# Patient Record
Sex: Female | Born: 1945 | Race: White | Hispanic: No | Marital: Single | Smoking: Never smoker
Health system: Southern US, Community
[De-identification: ages and names within clinical notes are randomized; demographics above are authoritative.]

## PROBLEM LIST (undated history)

## (undated) DIAGNOSIS — G4700 Insomnia, unspecified: Secondary | ICD-10-CM

## (undated) DIAGNOSIS — Z79890 Hormone replacement therapy (postmenopausal): Secondary | ICD-10-CM

## (undated) DIAGNOSIS — F329 Major depressive disorder, single episode, unspecified: Secondary | ICD-10-CM

## (undated) DIAGNOSIS — F32A Depression, unspecified: Secondary | ICD-10-CM

## (undated) DIAGNOSIS — M1990 Unspecified osteoarthritis, unspecified site: Secondary | ICD-10-CM

## (undated) DIAGNOSIS — F419 Anxiety disorder, unspecified: Secondary | ICD-10-CM

## (undated) HISTORY — DX: Insomnia, unspecified: G47.00

## (undated) HISTORY — DX: Hormone replacement therapy: Z79.890

## (undated) HISTORY — PX: BACK SURGERY: SHX140

---

## 2000-08-17 ENCOUNTER — Encounter: Payer: Self-pay | Admitting: *Deleted

## 2000-08-17 ENCOUNTER — Ambulatory Visit (HOSPITAL_COMMUNITY): Admission: RE | Admit: 2000-08-17 | Discharge: 2000-08-17 | Payer: Self-pay | Admitting: *Deleted

## 2000-08-20 ENCOUNTER — Ambulatory Visit (HOSPITAL_COMMUNITY): Admission: RE | Admit: 2000-08-20 | Discharge: 2000-08-20 | Payer: Self-pay | Admitting: *Deleted

## 2000-08-20 ENCOUNTER — Encounter: Payer: Self-pay | Admitting: *Deleted

## 2003-11-20 ENCOUNTER — Encounter: Admission: RE | Admit: 2003-11-20 | Discharge: 2003-11-20 | Payer: Self-pay | Admitting: Occupational Medicine

## 2011-08-13 ENCOUNTER — Emergency Department (HOSPITAL_COMMUNITY)
Admission: EM | Admit: 2011-08-13 | Discharge: 2011-08-14 | Disposition: A | Discharge status: Home / Self Care | Payer: BC Managed Care – PPO | Attending: Emergency Medicine | Admitting: Emergency Medicine

## 2011-08-13 ENCOUNTER — Emergency Department (HOSPITAL_COMMUNITY): Payer: BC Managed Care – PPO

## 2011-08-13 ENCOUNTER — Encounter (HOSPITAL_COMMUNITY): Payer: Self-pay | Admitting: Emergency Medicine

## 2011-08-13 DIAGNOSIS — IMO0002 Reserved for concepts with insufficient information to code with codable children: Secondary | ICD-10-CM | POA: Insufficient documentation

## 2011-08-13 DIAGNOSIS — M25539 Pain in unspecified wrist: Secondary | ICD-10-CM | POA: Insufficient documentation

## 2011-08-13 DIAGNOSIS — R51 Headache: Secondary | ICD-10-CM | POA: Insufficient documentation

## 2011-08-13 DIAGNOSIS — T148XXA Other injury of unspecified body region, initial encounter: Secondary | ICD-10-CM

## 2011-08-13 DIAGNOSIS — M79609 Pain in unspecified limb: Secondary | ICD-10-CM | POA: Insufficient documentation

## 2011-08-13 DIAGNOSIS — M25529 Pain in unspecified elbow: Secondary | ICD-10-CM | POA: Insufficient documentation

## 2011-08-13 HISTORY — DX: Major depressive disorder, single episode, unspecified: F32.9

## 2011-08-13 HISTORY — DX: Depression, unspecified: F32.A

## 2011-08-13 MED ORDER — OXYCODONE-ACETAMINOPHEN 5-325 MG PO TABS: 2.0000 | ORAL_TABLET | Freq: Four times a day (QID) | ORAL | Status: AC | PRN

## 2011-08-13 MED ORDER — MORPHINE SULFATE 4 MG/ML IJ SOLN
4.0000 mg | Freq: Once | INTRAMUSCULAR | Status: AC
  Administered 2011-08-13: 4 mg via INTRAMUSCULAR
  Filled 2011-08-13: qty 1

## 2011-08-13 MED ORDER — BACITRACIN ZINC 500 UNIT/GM EX OINT: TOPICAL_OINTMENT | Freq: Two times a day (BID) | CUTANEOUS | Status: AC

## 2011-08-13 MED ORDER — HYDROMORPHONE HCL PF 1 MG/ML IJ SOLN
1.0000 mg | Freq: Once | INTRAMUSCULAR | Status: AC
  Administered 2011-08-14: 1 mg via INTRAMUSCULAR
  Filled 2011-08-13: qty 1
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
## 2011-08-13 NOTE — ED Notes (Signed)
Family and pt requested to stay on wheelchair and not stretcher

## 2011-08-13 NOTE — Discharge Instructions (Signed)
Contusion A contusion is a deep bruise. Contusions are the result of an injury that caused bleeding under the skin. The contusion may turn blue, purple, or yellow. Minor injuries will give you a painless contusion, but more severe contusions may stay painful and swollen for a few weeks.  CAUSES  A contusion is usually caused by a blow, trauma, or direct force to an area of the body. SYMPTOMS   Swelling and redness of the injured area.   Bruising of the injured area.   Tenderness and soreness of the injured area.   Pain.  DIAGNOSIS  The diagnosis can be made by taking a history and physical exam. An X-ray, CT scan, or MRI may be needed to determine if there were any associated injuries, such as fractures. TREATMENT  Specific treatment will depend on what area of the body was injured. In general, the best treatment for a contusion is resting, icing, elevating, and applying cold compresses to the injured area. Over-the-counter medicines may also be recommended for pain control. Ask your caregiver what the best treatment is for your contusion. HOME CARE INSTRUCTIONS   Put ice on the injured area.   Put ice in a plastic bag.   Place a towel between your skin and the bag.   Leave the ice on for 15 to 20 minutes, 3 to 4 times a day.   Only take over-the-counter or prescription medicines for pain, discomfort, or fever as directed by your caregiver. Your caregiver may recommend avoiding anti-inflammatory medicines (aspirin, ibuprofen, and naproxen) for 48 hours because these medicines may increase bruising.   Rest the injured area.   If possible, elevate the injured area to reduce swelling.  SEEK IMMEDIATE MEDICAL CARE IF:   You have increased bruising or swelling.   You have pain that is getting worse.   Your swelling or pain is not relieved with medicines.  MAKE SURE YOU:   Understand these instructions.   Will watch your condition.   Will get help right away if you are not  doing well or get worse.  Document Released: 11/23/2004 Document Revised: 02/02/2011 Document Reviewed: 12/19/2010 [facility] Patient Information 2012 [place], [place].   Abrasions Abrasions are skin scrapes. Their treatment depends on how large and deep the abrasion is. Abrasions do not extend through all layers of the skin. A cut or lesion through all skin layers is called a laceration. HOME CARE INSTRUCTIONS   If you were given a dressing, change it at least once a day or as instructed by your caregiver. If the bandage sticks, soak it off with a solution of water or hydrogen peroxide.   Twice a day, wash the area with soap and water to remove all the cream/ointment. You may do this in a sink, under a tub faucet, or in a shower. Rinse off the soap and pat dry with a clean towel. Look for signs of infection (see below).   Reapply cream/ointment according to your caregiver's instruction. This will help prevent infection and keep the bandage from sticking. Telfa or gauze over the wound and under the dressing or wrap will also help keep the bandage from sticking.   If the bandage becomes wet, dirty, or develops a foul smell, change it as soon as possible.   Only take over-the-counter or prescription medicines for pain, discomfort, or fever as directed by your caregiver.  SEEK IMMEDIATE MEDICAL CARE IF:   Increasing pain in the wound.   Signs of infection develop: redness, swelling, surrounding  area is tender to touch, or pus coming from the wound.   You have a fever.   Any foul smell coming from the wound or dressing.  Most skin wounds heal within ten days. Facial wounds heal faster. However, an infection may occur despite proper treatment. You should have the wound checked for signs of infection within 24 to 48 hours or sooner if problems arise. If you were not given a wound-check appointment, look closely at the wound yourself on the second day for early signs of infection listed above. MAKE  SURE YOU:   Understand these instructions.   Will watch your condition.   Will get help right away if you are not doing well or get worse.  Document Released: 11/23/2004 Document Revised: 02/02/2011 Document Reviewed: 01/17/2011 [facility] Patient Information 2012 [place], [place].  RESOURCE GUIDE  Dental Problems  Patients with Medicaid: [facility] [phone] [address]                                           [phone] [address] Phone:  [phone]                                                   Phone:  [phone]  If unable to pay or uninsured, contact:  [facility] or [facility]. to become qualified for the adult dental clinic.  Chronic Pain Problems Contact [facility]  [phone] Patients need to be referred by their primary care doctor.  Insufficient Money for Medicine Contact [employer] Way:  call "211" or [facility] 715-334-9883.  No Primary Care Doctor Call [facility]  [phone] Other agencies that provide inexpensive medical care    [facility]  [phone]    [facility]  [phone]    [facility]  [phone]    [facility]  [phone]    [facility]  [phone]    [facility]  [phone]  Psychological Services [facility] Behavioral Health  [phone] [facility]  [phone] [facility] Mental Health   [phone] (emergency services [phone])  Abuse/Neglect [facility] Child Abuse Hotline 740-425-0583 [facility] Child Abuse Hotline [phone] (After Hours)  Emergency Shelter [facility] [phone]  [facility] [phone] [facility] [phone]  MRSA Hotline #:   (813)803-0741    [facility] Resources  [facility]  United [employer]                           [facility] Dept. [address]                      [address]         [address]  [address]  Cristobal Goldmann Phone:  [phone]                                  Phone:  [phone]                   Phone:  [phone]  [facility] Mental Health Phone:  [phone]  [facility] Child Abuse Hotline [phone] [phone] (After Hours)

## 2011-08-13 NOTE — ED Notes (Signed)
Requested Tobi Bastos, [place] to get pt's medical records from [facility].
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
## 2011-08-14 NOTE — ED Notes (Signed)
Prescription for bacitracin ung 102 g no refill apply topically 2 times daily per discharge instructions per dr Soledad Gerlach webb called in to [facility] at [phone].

## 2011-08-14 NOTE — ED Notes (Signed)
Pt states understanding of discharge instructions 

## 2012-06-05 ENCOUNTER — Other Ambulatory Visit: Payer: Self-pay | Admitting: Family Medicine

## 2012-06-05 ENCOUNTER — Telehealth: Payer: Self-pay | Admitting: Family Medicine

## 2012-06-05 NOTE — Telephone Encounter (Signed)
Chart shows we have never prescribed this med for patient.  Pt has not been seen here in over one year.   Refill sent to pharmacy denied.  Dr Tanya Nones informed.

## 2012-06-06 MED ORDER — TRAZODONE HCL 100 MG PO TABS: ORAL_TABLET | ORAL | Status: DC

## 2012-06-06 MED ORDER — CLONAZEPAM 1 MG PO TABS: 1.0000 mg | ORAL_TABLET | Freq: Two times a day (BID) | ORAL | Status: DC | PRN

## 2012-06-06 NOTE — Telephone Encounter (Signed)
.  [facility]

## 2012-06-06 NOTE — Telephone Encounter (Signed)
Ok to refill both?? 

## 2012-07-05 ENCOUNTER — Telehealth: Payer: Self-pay | Admitting: Family Medicine

## 2012-07-05 MED ORDER — TRAZODONE HCL 100 MG PO TABS: ORAL_TABLET | ORAL | Status: DC

## 2012-07-05 MED ORDER — CLONAZEPAM 1 MG PO TABS: 1.0000 mg | ORAL_TABLET | Freq: Two times a day (BID) | ORAL | Status: DC | PRN

## 2012-07-05 NOTE — Telephone Encounter (Signed)
Rx Refilled  

## 2012-07-05 NOTE — Telephone Encounter (Signed)
Ok to refill 

## 2012-07-05 NOTE — Telephone Encounter (Signed)
?   OK to Refill  

## 2012-07-10 ENCOUNTER — Encounter: Payer: Self-pay | Admitting: Physician Assistant

## 2012-07-10 ENCOUNTER — Ambulatory Visit (INDEPENDENT_AMBULATORY_CARE_PROVIDER_SITE_OTHER): Payer: Medicare Other | Admitting: Physician Assistant

## 2012-07-10 VITALS — BP 140/86 | HR 60 | Temp 98.3°F | Resp 20

## 2012-07-10 DIAGNOSIS — M542 Cervicalgia: Secondary | ICD-10-CM

## 2012-07-10 MED ORDER — MELOXICAM 7.5 MG PO TABS: 7.5000 mg | ORAL_TABLET | Freq: Every day | ORAL | Status: DC

## 2012-07-10 MED ORDER — CYCLOBENZAPRINE HCL 10 MG PO TABS: 10.0000 mg | ORAL_TABLET | Freq: Three times a day (TID) | ORAL | Status: DC | PRN
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
## 2012-08-05 ENCOUNTER — Other Ambulatory Visit: Payer: Self-pay | Admitting: Family Medicine

## 2012-08-06 ENCOUNTER — Telehealth: Payer: Self-pay | Admitting: Family Medicine

## 2012-08-06 NOTE — Telephone Encounter (Signed)
Ok to refill with 5 refill 

## 2012-08-06 NOTE — Telephone Encounter (Signed)
Klonopin can be refilled x 2.

## 2012-08-06 NOTE — Telephone Encounter (Signed)
?  ok to refill °

## 2012-08-07 NOTE — Telephone Encounter (Signed)
?   OK to Refill  

## 2012-08-07 NOTE — Telephone Encounter (Signed)
Ok to refill 

## 2012-08-09 ENCOUNTER — Other Ambulatory Visit: Payer: Self-pay | Admitting: Family Medicine

## 2012-08-09 DIAGNOSIS — M542 Cervicalgia: Secondary | ICD-10-CM

## 2012-08-09 MED ORDER — CYCLOBENZAPRINE HCL 10 MG PO TABS: 10.0000 mg | ORAL_TABLET | Freq: Three times a day (TID) | ORAL | Status: DC | PRN

## 2012-08-09 NOTE — Telephone Encounter (Signed)
done

## 2012-08-09 NOTE — Telephone Encounter (Signed)
Rx Refilled  

## 2012-08-27 ENCOUNTER — Other Ambulatory Visit: Payer: Self-pay | Admitting: Family Medicine

## 2012-08-27 NOTE — Telephone Encounter (Signed)
Med refilled.

## 2012-08-27 NOTE — Telephone Encounter (Signed)
?  ok to refill °

## 2012-08-27 NOTE — Telephone Encounter (Signed)
OKAY TO REFILL?

## 2012-09-04 ENCOUNTER — Ambulatory Visit (INDEPENDENT_AMBULATORY_CARE_PROVIDER_SITE_OTHER): Payer: Medicare Other | Admitting: Family Medicine

## 2012-09-04 ENCOUNTER — Encounter: Payer: Self-pay | Admitting: Family Medicine

## 2012-09-04 VITALS — BP 156/90 | HR 63 | Temp 98.2°F | Resp 16

## 2012-09-04 DIAGNOSIS — Z79890 Hormone replacement therapy (postmenopausal): Secondary | ICD-10-CM | POA: Insufficient documentation

## 2012-09-04 DIAGNOSIS — M6530 Trigger finger, unspecified finger: Secondary | ICD-10-CM

## 2012-09-04 DIAGNOSIS — M75100 Unspecified rotator cuff tear or rupture of unspecified shoulder, not specified as traumatic: Secondary | ICD-10-CM

## 2012-09-04 DIAGNOSIS — M7551 Bursitis of right shoulder: Secondary | ICD-10-CM

## 2012-09-04 DIAGNOSIS — G4700 Insomnia, unspecified: Secondary | ICD-10-CM | POA: Insufficient documentation

## 2012-09-04 MED ORDER — TRAZODONE HCL 100 MG PO TABS: ORAL_TABLET | ORAL | Status: DC

## 2012-09-04 MED ORDER — CONJ ESTROG-MEDROXYPROGEST ACE 0.45-1.5 MG PO TABS: 1.0000 | ORAL_TABLET | Freq: Every day | ORAL | Status: DC

## 2012-09-04 MED ORDER — CLONAZEPAM 1 MG PO TABS: 1.0000 mg | ORAL_TABLET | Freq: Two times a day (BID) | ORAL | Status: DC | PRN

## 2012-09-04 MED ORDER — CYCLOBENZAPRINE HCL 10 MG PO TABS: ORAL_TABLET | ORAL | Status: DC

## 2012-09-04 NOTE — Progress Notes (Signed)
Subjective:    Patient ID: Rachel Kaufman, female    DOB: [birth year], 67 y.o.   MRN: [id number]  HPI  Patient presents with 2 concerns. #1 she complains of pain in her right shoulder. It throbs and aches at night. The pain is worse with abduction and range of motion in the shoulder. It did improve with Flexeril and an anti-inflammatory. The pain has been there now for several months.  She also is complaining of pain in her left thumb. The thumb locks at the IP joint and MCP joint with flexion. This has been occurring for several weeks. It is now tender around the MCP and IP joint. Past Medical History  Diagnosis Date  . Depression   . Insomnia   . Postmenopausal HRT (hormone replacement therapy)    Current Outpatient Prescriptions on File Prior to Visit  Medication Sig Dispense Refill  . buPROPion (WELLBUTRIN XL) 300 MG 24 hr tablet TAKE 1 TABLET BY MOUTH EVERY MORNING  30 tablet  5  . escitalopram (LEXAPRO) 20 MG tablet Take 20 mg by mouth daily.      . fish oil-omega-3 fatty acids 1000 MG capsule Take 2 g by mouth daily.      . vitamin B-12 (CYANOCOBALAMIN) 1000 MCG tablet Take 1,000 mcg by mouth daily.      . vitamin C (ASCORBIC ACID) 500 MG tablet Take 500 mg by mouth daily.      . meloxicam (MOBIC) 7.5 MG tablet Take 1 tablet (7.5 mg total) by mouth daily.  30 tablet  0  . oxyCODONE-acetaminophen (PERCOCET) 5-325 MG per tablet Take 1 tablet by mouth every 4 (four) hours as needed.       No current facility-administered medications on file prior to visit.   Allergies  Allergen Reactions  . Codeine Nausea And Vomiting   History   Social History  . Marital Status: Single    Spouse Name: N/A    Number of Children: N/A  . Years of Education: N/A   Occupational History  . Not on file.   Social History Main Topics  . Smoking status: Never Smoker   . Smokeless tobacco: Not on file  . Alcohol Use: Yes  . Drug Use: Yes    Special: Marijuana  . Sexually Active:    Other  Topics Concern  . Not on file   Social History Narrative  . No narrative on file     Review of Systems  All other systems reviewed and are negative.       Objective:   Physical Exam  Vitals reviewed. Cardiovascular: Normal rate, regular rhythm and normal heart sounds.   Pulmonary/Chest: Effort normal and breath sounds normal.   patient has a positive empty can sign in the right shoulder, she has a positive Hawkins maneuver in the right shoulder. She has a negative Spurling's sign. She has normal reflexes in the right arm as well as normal strength. She has pain with abduction greater than 90. She has a palpable trigger finger on the volar aspect of the left first Mcp joint.  Repeat blood pressure is 130/90        Assessment & Plan:  1. Subacromial bursitis, right Using sterile technique, the right subacromial space was injected with a combination of 2 cc Marcaine, 2 cc of lidocaine, and 2 cc of 40 mg per mL Kenalog. The patient is to follow up in one to 2 weeks if no better.  Warnings were given regarding septic arthritis 2. Trigger  finger, acquired Recommended she see her orthopedist for a trigger finger injection which I do not perform.  She is scheduled to see Dr. Ranell Patrick later this month and will ask him to  perform the injection

## 2012-09-05 ENCOUNTER — Telehealth: Payer: Self-pay | Admitting: Family Medicine

## 2012-09-06 MED ORDER — ZOSTER VACCINE LIVE 19400 UNT/0.65ML ~~LOC~~ SOLR: 0.6500 mL | Freq: Once | SUBCUTANEOUS | Status: DC

## 2012-09-06 NOTE — Telephone Encounter (Signed)
Printed and mailed

## 2012-09-29 ENCOUNTER — Other Ambulatory Visit: Payer: Self-pay | Admitting: Family Medicine

## 2012-09-30 NOTE — Telephone Encounter (Signed)
Medication refilled per protocol. 

## 2012-10-09 ENCOUNTER — Other Ambulatory Visit: Payer: Self-pay | Admitting: Family Medicine

## 2012-10-09 NOTE — Telephone Encounter (Signed)
?   OK to Refill  

## 2012-10-10 NOTE — Telephone Encounter (Signed)
ok 

## 2012-10-10 NOTE — Telephone Encounter (Signed)
Med phoned in °

## 2012-10-14 ENCOUNTER — Emergency Department (HOSPITAL_BASED_OUTPATIENT_CLINIC_OR_DEPARTMENT_OTHER)
Admission: EM | Admit: 2012-10-14 | Discharge: 2012-10-14 | Disposition: A | Discharge status: Home / Self Care | Payer: Medicare Other | Attending: Emergency Medicine | Admitting: Emergency Medicine

## 2012-10-14 ENCOUNTER — Emergency Department (HOSPITAL_BASED_OUTPATIENT_CLINIC_OR_DEPARTMENT_OTHER): Payer: Medicare Other

## 2012-10-14 ENCOUNTER — Encounter (HOSPITAL_BASED_OUTPATIENT_CLINIC_OR_DEPARTMENT_OTHER): Payer: Self-pay | Admitting: *Deleted

## 2012-10-14 DIAGNOSIS — S4991XA Unspecified injury of right shoulder and upper arm, initial encounter: Secondary | ICD-10-CM

## 2012-10-14 DIAGNOSIS — F329 Major depressive disorder, single episode, unspecified: Secondary | ICD-10-CM | POA: Insufficient documentation

## 2012-10-14 DIAGNOSIS — Z79899 Other long term (current) drug therapy: Secondary | ICD-10-CM | POA: Insufficient documentation

## 2012-10-14 DIAGNOSIS — G4700 Insomnia, unspecified: Secondary | ICD-10-CM | POA: Insufficient documentation

## 2012-10-14 DIAGNOSIS — Y9389 Activity, other specified: Secondary | ICD-10-CM | POA: Insufficient documentation

## 2012-10-14 DIAGNOSIS — R296 Repeated falls: Secondary | ICD-10-CM | POA: Insufficient documentation

## 2012-10-14 DIAGNOSIS — S4980XA Other specified injuries of shoulder and upper arm, unspecified arm, initial encounter: Secondary | ICD-10-CM | POA: Insufficient documentation

## 2012-10-14 DIAGNOSIS — Z0489 Encounter for examination and observation for other specified reasons: Secondary | ICD-10-CM | POA: Insufficient documentation

## 2012-10-14 DIAGNOSIS — Y929 Unspecified place or not applicable: Secondary | ICD-10-CM | POA: Insufficient documentation

## 2012-10-14 DIAGNOSIS — S62309A Unspecified fracture of unspecified metacarpal bone, initial encounter for closed fracture: Secondary | ICD-10-CM

## 2012-10-14 DIAGNOSIS — S62319A Displaced fracture of base of unspecified metacarpal bone, initial encounter for closed fracture: Secondary | ICD-10-CM | POA: Insufficient documentation

## 2012-10-14 DIAGNOSIS — F3289 Other specified depressive episodes: Secondary | ICD-10-CM | POA: Insufficient documentation

## 2012-10-14 DIAGNOSIS — S46909A Unspecified injury of unspecified muscle, fascia and tendon at shoulder and upper arm level, unspecified arm, initial encounter: Secondary | ICD-10-CM | POA: Insufficient documentation

## 2012-10-14 MED ORDER — TRAMADOL HCL 50 MG PO TABS
50.0000 mg | ORAL_TABLET | Freq: Once | ORAL | Status: AC
  Administered 2012-10-14: 50 mg via ORAL
  Filled 2012-10-14: qty 1

## 2012-10-14 MED ORDER — TRAMADOL HCL 50 MG PO TABS: 50.0000 mg | ORAL_TABLET | Freq: Four times a day (QID) | ORAL | Status: DC | PRN

## 2012-10-14 NOTE — ED Provider Notes (Signed)
CSN: [id number]     Arrival date & time 10/14/12  1341 History     First MD Initiated Contact with Patient 10/14/12 1349     Chief Complaint  Patient presents with  . Fall   (Consider location/radiation/quality/duration/timing/severity/associated sxs/prior Treatment) HPI Comments: Patient is a 67 year old female who presents with 3 day history of right shoulder and right hand pain that started immediately after a mechanical fall. Patient reports bracing her fall with her right hand. The pain is throbbing and severe without radiation. Patient reports associated right hand swelling and bruising. Movement and palpation of each joint makes the pain worse. No alleviating factors. Patient denies any other injury.    Past Medical History  Diagnosis Date  . Depression   . Insomnia   . Postmenopausal HRT (hormone replacement therapy)    Past Surgical History  Procedure Laterality Date  . Back surgery     History reviewed. No pertinent family history. History  Substance Use Topics  . Smoking status: Never Smoker   . Smokeless tobacco: Not on file  . Alcohol Use: Yes   OB History   Grav Para Term Preterm Abortions TAB SAB Ect Mult Living                 Review of Systems  Musculoskeletal: Positive for joint swelling and arthralgias.  All other systems reviewed and are negative.    Allergies  Codeine  Home Medications   Current Outpatient Rx  Name  Route  Sig  Dispense  Refill  . buPROPion (WELLBUTRIN XL) 300 MG 24 hr tablet      TAKE 1 TABLET BY MOUTH EVERY MORNING   30 tablet   5   . clonazePAM (KLONOPIN) 1 MG tablet   Oral   Take 1 tablet (1 mg total) by mouth 2 (two) times daily as needed for anxiety.   60 tablet   2   . cyclobenzaprine (FLEXERIL) 10 MG tablet      TAKE 1 TABLET 3 TIMES A DAY AS NEEDED FOR MUSCLE SPASMS   30 tablet   0   . escitalopram (LEXAPRO) 20 MG tablet   Oral   Take 20 mg by mouth daily.         . fish oil-omega-3 fatty acids  1000 MG capsule   Oral   Take 2 g by mouth daily.         . meloxicam (MOBIC) 7.5 MG tablet   Oral   Take 1 tablet (7.5 mg total) by mouth daily.   30 tablet   0   . PREMPRO 0.45-1.5 MG per tablet      TAKE 1 TABLET DAILY (NEED APPT)   28 tablet   4   . traZODone (DESYREL) 100 MG tablet      TAKE 2 TABLETS BY MOUTH AT BEDTIME   60 tablet   5   . vitamin B-12 (CYANOCOBALAMIN) 1000 MCG tablet   Oral   Take 1,000 mcg by mouth daily.         . vitamin C (ASCORBIC ACID) 500 MG tablet   Oral   Take 500 mg by mouth daily.         Marland Kitchen zoster vaccine live, PF, (ZOSTAVAX) 82956 UNT/0.65ML injection   Subcutaneous   Inject 19,400 Units into the skin once.   1 each   0    BP 125/70  Pulse 78  Temp(Src) 98.5 F (36.9 C) (Oral)  Resp 18  Ht 5'  3" (1.6 m)  Wt 130 lb (58.968 kg)  BMI 23.03 kg/m2  SpO2 97% Physical Exam  Nursing note and vitals reviewed. Constitutional: She is oriented to person, place, and time. She appears well-developed and well-nourished. No distress.  HENT:  Head: Normocephalic and atraumatic.  Eyes: Conjunctivae and EOM are normal.  Neck: Normal range of motion.  Cardiovascular: Normal rate and regular rhythm.  Exam reveals no gallop and no friction rub.   No murmur heard. Pulmonary/Chest: Effort normal and breath sounds normal. She has no wheezes. She has no rales. She exhibits no tenderness.  Abdominal: Soft. There is no tenderness.  Musculoskeletal:  Right shoulder-ROM limited due to pain, mild anterior tenderness to palpation, no obvious deformity  Right hand-ROM of hand and fingers limited due to pain and swelling, dorsal bruising noted, tenderness to palpation over 5th metacarpal bone, no obvious deformity  Neurological: She is alert and oriented to person, place, and time. Coordination normal.  Upper extremity sensation equal and intact bilaterally. Speech is goal-oriented. Moves limbs without ataxia.   Skin: Skin is warm and dry.   Psychiatric: She has a normal mood and affect. Her behavior is normal.    ED Course   Procedures (including critical care time)  SPLINT APPLICATION Date/Time: 07/05/2012 3:38 PM Authorized by: Emilia Beck Consent: Verbal consent obtained. Risks and benefits: risks, benefits and alternatives were discussed Consent given by: patient Splint applied by: orthopedic technician Location details: right wrist Splint type: ulnar gutter Supplies used: ortho glass Post-procedure: The splinted body part was neurovascularly unchanged following the procedure. Patient tolerance: Patient tolerated the procedure well with no immediate complications.   Labs Reviewed - No data to display Dg Shoulder Right  10/14/2012   *RADIOLOGY REPORT*  Clinical Data: 67 year old female with right shoulder pain following fall  RIGHT SHOULDER - 2+ VIEW  Comparison: None  Findings: There is no evidence of acute fracture, subluxation or dislocation. Degenerative changes at the glenohumeral and AC joints noted. No focal bony lesions are identified. The visualized right bony thorax is unremarkable.  IMPRESSION: No evidence of acute bony abnormality.   Original Report Authenticated By: Harmon Pier, M.D.   Dg Hand Complete Right  10/14/2012   *RADIOLOGY REPORT*  Clinical Data: 67 year old female with right hand injury and pain.  RIGHT HAND - COMPLETE 3+ VIEW  Comparison: None  Findings: A fracture of the proximal fifth metacarpal is identified with 2 mm posterior and medial displacement. This fracture does not appear to extend into the carpometacarpal joint. There is no evidence of subluxation or dislocation. No other focal bony abnormalities are identified.  IMPRESSION: Minimally displaced proximal fifth metacarpal fracture.   Original Report Authenticated By: Harmon Pier, M.D.   1. Metacarpal bone fracture, closed, initial encounter   2. Right shoulder injury, initial encounter     MDM  2:54 PM Shoulder xray  unremarkable. Right hand xray shows proximal 5th metacarpal fracture. Patient will have ulnar gutter splint and follow up with Orthopedics. No neurovascular compromise.   Emilia Beck, PA-C 10/14/12 1523

## 2012-10-14 NOTE — ED Notes (Signed)
Pt c/o fall from standing injury ing right shoulder and left hand x 3 days ago

## 2012-10-14 NOTE — ED Provider Notes (Signed)
Medical screening examination/treatment/procedure(s) were performed by non-physician practitioner and as supervising physician I was immediately available for consultation/collaboration.   Lyanne Co, MD 10/14/12 [phone]

## 2012-10-23 ENCOUNTER — Telehealth: Payer: Self-pay | Admitting: Family Medicine

## 2012-10-23 NOTE — Telephone Encounter (Signed)
?   OK to Refill  

## 2012-10-24 NOTE — Telephone Encounter (Signed)
She can have 1 refill but no further.  I am not sure what doses she is on, can we verify with patient.  She needs to find GYN because I am not comfortable prescribing testosterone to a female.

## 2012-10-25 MED ORDER — UNABLE TO FIND: Status: DC

## 2012-10-25 NOTE — Telephone Encounter (Signed)
.  Rx Refilled and Patient aware

## 2012-10-25 NOTE — Addendum Note (Signed)
Addended by: Legrand Rams B on: 10/25/2012 10:10 AM   Modules accepted: Orders

## 2012-11-05 ENCOUNTER — Other Ambulatory Visit: Payer: Self-pay | Admitting: Family Medicine

## 2012-11-05 NOTE — Telephone Encounter (Signed)
ok 

## 2012-11-05 NOTE — Telephone Encounter (Signed)
Ok to refill 

## 2012-12-03 ENCOUNTER — Other Ambulatory Visit: Payer: Self-pay | Admitting: Family Medicine

## 2012-12-03 NOTE — Telephone Encounter (Signed)
Last OV 7/9  Last RF 9/9/ #30  OK refill?

## 2012-12-03 NOTE — Telephone Encounter (Signed)
ok 

## 2012-12-03 NOTE — Telephone Encounter (Signed)
RX called in .

## 2012-12-09 ENCOUNTER — Other Ambulatory Visit: Payer: Self-pay | Admitting: Family Medicine

## 2012-12-09 MED ORDER — ESCITALOPRAM OXALATE 20 MG PO TABS: 20.0000 mg | ORAL_TABLET | Freq: Every day | ORAL | Status: DC

## 2012-12-09 NOTE — Telephone Encounter (Signed)
Rx Refilled  

## 2012-12-13 ENCOUNTER — Ambulatory Visit: Payer: Medicare Other | Admitting: Family Medicine

## 2012-12-30 ENCOUNTER — Other Ambulatory Visit: Payer: Self-pay | Admitting: Family Medicine

## 2012-12-30 NOTE — Telephone Encounter (Signed)
rx called in

## 2012-12-30 NOTE — Telephone Encounter (Signed)
ok 

## 2012-12-30 NOTE — Telephone Encounter (Signed)
?   OK to Refill  

## 2013-01-27 ENCOUNTER — Other Ambulatory Visit: Payer: Self-pay | Admitting: Family Medicine

## 2013-01-27 NOTE — Telephone Encounter (Signed)
Last OV 09/04/12.  Last RF clonazepam 7/9 #60 + 2   Last RF cyclobenzaprine 11/3  #30.  OK refills?

## 2013-01-28 NOTE — Telephone Encounter (Signed)
ok 

## 2013-02-28 ENCOUNTER — Other Ambulatory Visit: Payer: Self-pay | Admitting: Family Medicine

## 2013-02-28 NOTE — Telephone Encounter (Signed)
ok °

## 2013-02-28 NOTE — Telephone Encounter (Signed)
.?   OK to Refill

## 2013-03-25 ENCOUNTER — Other Ambulatory Visit: Payer: Self-pay | Admitting: Family Medicine

## 2013-03-25 ENCOUNTER — Encounter: Payer: Self-pay | Admitting: Family Medicine

## 2013-03-25 NOTE — Telephone Encounter (Signed)
Medication refill for one time only.  Patient needs to be seen.  Letter sent for patient to call and schedule

## 2013-04-11 ENCOUNTER — Ambulatory Visit (INDEPENDENT_AMBULATORY_CARE_PROVIDER_SITE_OTHER): Payer: Medicare Other | Admitting: Family Medicine

## 2013-04-11 ENCOUNTER — Encounter: Payer: Self-pay | Admitting: Family Medicine

## 2013-04-11 VITALS — BP 126/82 | HR 70 | Temp 97.3°F | Resp 14 | Ht 63.0 in | Wt 125.0 lb

## 2013-04-11 DIAGNOSIS — M5010 Cervical disc disorder with radiculopathy, unspecified cervical region: Secondary | ICD-10-CM

## 2013-04-11 DIAGNOSIS — Z13220 Encounter for screening for lipoid disorders: Secondary | ICD-10-CM

## 2013-04-11 DIAGNOSIS — F329 Major depressive disorder, single episode, unspecified: Secondary | ICD-10-CM

## 2013-04-11 DIAGNOSIS — M5412 Radiculopathy, cervical region: Secondary | ICD-10-CM

## 2013-04-11 DIAGNOSIS — F3289 Other specified depressive episodes: Secondary | ICD-10-CM

## 2013-04-11 DIAGNOSIS — F32A Depression, unspecified: Secondary | ICD-10-CM

## 2013-04-11 MED ORDER — LORAZEPAM 1 MG PO TABS: 1.0000 mg | ORAL_TABLET | Freq: Two times a day (BID) | ORAL | Status: DC | PRN

## 2013-04-11 MED ORDER — PREDNISONE 20 MG PO TABS: ORAL_TABLET | ORAL | Status: DC

## 2013-04-11 NOTE — Progress Notes (Signed)
Subjective:    Patient ID: Rachel Kaufman, female    DOB: 10/25/1945, 68 y.o.   MRN: [id number]  HPI Patient was asked to come in for a medication check. She is currently taking Wellbutrin XL 300 mg by mouth daily as well as Lexapro 20 mg by mouth daily for depression. She is satisfied with the success of his medication. She is however requiring Klonopin 1 mg by mouth twice a day for anxiety. Her husband was recently diagnosed with throat cancer. This is triggered significant anxiety and occasional panic attacks. The patient does not believe the Klonopin and strong enough. She would like to try something with a shorter half-life is alert and stronger to help her deal with anxiety as she has become habituated to the Klonopin. She is also overdue for lab work. She is also overdue for her pneumonia vaccine. She also needs a refill on her prempro which she takes for vasomotor symptoms along with vaginal dryness. She is in bed increase risk of breast cancer, blood clots, and stroke on home replacement therapy. The patient states that she would "take her chances" rather than stop the medication. Unfortunately she also has developed significant pain in her right shoulder. The pain radiates from the right side of her neck into her right shoulder and down her right arm into her right fingers she describes as a dull constant ache. Chest is pain in the right shoulder with abduction greater than 90. Past Medical History  Diagnosis Date   Depression    Insomnia    Postmenopausal HRT (hormone replacement therapy)    Current Outpatient Prescriptions on File Prior to Visit  Medication Sig Dispense Refill   buPROPion (WELLBUTRIN XL) 300 MG 24 hr tablet TAKE 1 TABLET BY MOUTH EVERY MORNING  30 tablet  0   clonazePAM (KLONOPIN) 1 MG tablet TAKE 1 TABLET TWICE DAILY AS NEEDED FOR ANXIETY  60 tablet  2   cyclobenzaprine (FLEXERIL) 10 MG tablet TAKE 1 TABLET BY MOUTH 3 TIMES A DAY AS NEEDED  30 tablet  0    escitalopram (LEXAPRO) 20 MG tablet TAKE 1 TABLET (20 MG TOTAL) BY MOUTH DAILY.  30 tablet  3   fish oil-omega-3 fatty acids 1000 MG capsule Take 2 g by mouth daily.       PREMPRO 0.45-1.5 MG per tablet TAKE 1 TABLET DAILY (NEED APPT)  28 tablet  4   traMADol (ULTRAM) 50 MG tablet Take 1 tablet (50 mg total) by mouth every 6 (six) hours as needed for pain.  15 tablet  0   traZODone (DESYREL) 100 MG tablet TAKE 2 TABLETS BY MOUTH AT BEDTIME  60 tablet  5   UNABLE TO FIND Testosterone 8% mix with cetaphil cream - Apply prn - could not find this medication in our computer - her rx # is [id number] and she can have it filled just once and at last gram as well not sure how much she gets at a time. thanks  30 g  0   vitamin B-12 (CYANOCOBALAMIN) 1000 MCG tablet Take 1,000 mcg by mouth daily.       vitamin C (ASCORBIC ACID) 500 MG tablet Take 500 mg by mouth daily.       No current facility-administered medications on file prior to visit.   Allergies  Allergen Reactions   Codeine Nausea And Vomiting   History   Social History   Marital Status: Single    Spouse Name: N/A    Number of  Children: N/A   Years of Education: N/A   Occupational History   Not on file.   Social History Main Topics   Smoking status: Never Smoker    Smokeless tobacco: Not on file   Alcohol Use: Yes   Drug Use: Yes    Special: Marijuana   Sexual Activity: Yes    Birth Control/ Protection: None   Other Topics Concern   Not on file   Social History Narrative   No narrative on file      Review of Systems  All other systems reviewed and are negative.       Objective:   Physical Exam  Vitals reviewed. Neck: Neck supple. No thyromegaly present.  Cardiovascular: Normal rate, regular rhythm and normal heart sounds.   No murmur heard. Pulmonary/Chest: Effort normal and breath sounds normal. No respiratory distress. She has no wheezes. She has no rales.  Abdominal: Soft. Bowel sounds are  normal. She exhibits no distension. There is no tenderness. There is no rebound and no guarding.  Musculoskeletal:       Right shoulder: She exhibits decreased range of motion and pain. She exhibits no spasm and normal strength.  Lymphadenopathy:    She has no cervical adenopathy.   patient has a mildly positive Hawkins sign. She has a mildly positive empty can sign in the right shoulder. Chest pain with abduction greater than 90. She also has a positive Spurling sign in the neck        Assessment & Plan:  1. Cervical disc disorder with radiculopathy of cervical region The patient may have 2 problems including cervical disc disorder with radiculopathy as well as possible subacromial bursitis. I recommended an x-ray of the neck as well as the shoulder. Currently the patient is too preoccupied in caring for her husband to go for imaging. She would like to try a prednisone taper pack to see if this will alleviate some of the pain in her neck and shoulder. - predniSONE (DELTASONE) 20 MG tablet; 3 tabs poqday 1-2, 2 tabs poqday 3-4, 1 tab poqday 5-6  Dispense: 12 tablet; Refill: 0  2. Screening cholesterol level Obtain baseline screening labs. - COMPLETE METABOLIC PANEL WITH GFR - Lipid panel - CBC with Differential  3. Depression Continue Lexapro and Wellbutrin. The patient can continue to use trazodone to help her sleep at night. Discontinue Klonopin and switch the patient temporarily to Ativan 1 mg by mouth twice a day for anxiety. The patient has become habituated to Klonopin and it is no longer helping with the breakthrough panic attacks. Given the recent stress that she is under with her husband's throat cancer diagnosis I believe she temporarily needs a new medication that can help treat her panic attacks more effectively.

## 2013-04-14 ENCOUNTER — Other Ambulatory Visit: Payer: Self-pay | Admitting: Family Medicine

## 2013-04-21 ENCOUNTER — Other Ambulatory Visit: Payer: Self-pay | Admitting: Family Medicine

## 2013-04-21 NOTE — Telephone Encounter (Signed)
Refill appropriate and filled per protocol. °

## 2013-05-15 ENCOUNTER — Other Ambulatory Visit: Payer: Self-pay | Admitting: Family Medicine

## 2013-05-15 NOTE — Telephone Encounter (Signed)
Prescription sent to pharmacy.

## 2013-05-15 NOTE — Telephone Encounter (Signed)
ok 

## 2013-05-15 NOTE — Telephone Encounter (Signed)
Ok to refill??  Last office visit 04/11/2013.  Last refill 04/22/2013.

## 2013-05-28 ENCOUNTER — Other Ambulatory Visit: Payer: Self-pay | Admitting: Orthopedic Surgery

## 2013-05-28 DIAGNOSIS — S129XXA Fracture of neck, unspecified, initial encounter: Secondary | ICD-10-CM

## 2013-05-30 ENCOUNTER — Ambulatory Visit
Admission: RE | Admit: 2013-05-30 | Discharge: 2013-05-30 | Disposition: A | Discharge status: Home / Self Care | Payer: Medicare Other | Source: Ambulatory Visit | Attending: Orthopedic Surgery | Admitting: Orthopedic Surgery

## 2013-05-30 DIAGNOSIS — S129XXA Fracture of neck, unspecified, initial encounter: Secondary | ICD-10-CM

## 2013-06-09 ENCOUNTER — Other Ambulatory Visit: Payer: Self-pay | Admitting: Family Medicine

## 2013-06-09 NOTE — Telephone Encounter (Signed)
05/15/13 #30 Last RF Flexeril.    04/11/13 #60 + 1 last Rf Ativan  OK refill?

## 2013-06-09 NOTE — Telephone Encounter (Signed)
RX faxed to pharmacy.

## 2013-07-02 ENCOUNTER — Encounter: Payer: Self-pay | Admitting: Family Medicine

## 2013-07-07 ENCOUNTER — Other Ambulatory Visit: Payer: Self-pay | Admitting: Family Medicine

## 2013-07-07 NOTE — Telephone Encounter (Signed)
Medication called to pharmacy. 

## 2013-07-07 NOTE — Telephone Encounter (Signed)
ok 

## 2013-07-07 NOTE — Telephone Encounter (Signed)
Ok to refill??  Last office visit 04/01/2013.  Last refill 06/09/2013.

## 2013-07-08 LAB — LIPID PANEL
Cholesterol: 189 mg/dL (ref 0–200)
HDL: 71 mg/dL (ref >39)
LDL Cholesterol: 101 mg/dL — ABNORMAL HIGH (ref 0–99)
Total CHOL/HDL Ratio: 2.7 Ratio
Triglycerides: 84 mg/dL (ref <150)
VLDL: 17 mg/dL (ref 0–40)

## 2013-07-08 LAB — CBC WITH DIFFERENTIAL/PLATELET
Basophils Absolute: 0 10*3/uL (ref 0.0–0.1)
Basophils Relative: 0 % (ref 0–1)
Eosinophils Absolute: 0.1 10*3/uL (ref 0.0–0.7)
Eosinophils Relative: 2 % (ref 0–5)
HCT: 36.7 % (ref 36.0–46.0)
Hemoglobin: 12.1 g/dL (ref 12.0–15.0)
Lymphocytes Relative: 22 % (ref 12–46)
Lymphs Abs: 0.9 10*3/uL (ref 0.7–4.0)
MCH: 31 pg (ref 26.0–34.0)
MCHC: 33 g/dL (ref 30.0–36.0)
MCV: 94.1 fL (ref 78.0–100.0)
Monocytes Absolute: 0.3 10*3/uL (ref 0.1–1.0)
Monocytes Relative: 7 % (ref 3–12)
Neutro Abs: 2.7 10*3/uL (ref 1.7–7.7)
Neutrophils Relative %: 69 % (ref 43–77)
Platelets: 194 10*3/uL (ref 150–400)
RBC: 3.9 MIL/uL (ref 3.87–5.11)
RDW: 13.8 % (ref 11.5–15.5)
WBC: 3.9 10*3/uL — ABNORMAL LOW (ref 4.0–10.5)

## 2013-07-08 LAB — COMPLETE METABOLIC PANEL WITH GFR
ALT: 11 U/L (ref 0–35)
AST: 16 U/L (ref 0–37)
Albumin: 4.3 g/dL (ref 3.5–5.2)
Alkaline Phosphatase: 39 U/L (ref 39–117)
BUN: 22 mg/dL (ref 6–23)
CO2: 23 mEq/L (ref 19–32)
Calcium: 9.1 mg/dL (ref 8.4–10.5)
Chloride: 106 mEq/L (ref 96–112)
Creat: 0.85 mg/dL (ref 0.50–1.10)
GFR, Est African American: 83 mL/min
GFR, Est Non African American: 72 mL/min
Glucose, Bld: 88 mg/dL (ref 70–99)
Potassium: 4 mEq/L (ref 3.5–5.3)
Sodium: 142 mEq/L (ref 135–145)
Total Bilirubin: 0.5 mg/dL (ref 0.2–1.2)
Total Protein: 6.3 g/dL (ref 6.0–8.3)

## 2013-08-07 ENCOUNTER — Other Ambulatory Visit: Payer: Self-pay | Admitting: Family Medicine

## 2013-08-07 NOTE — Telephone Encounter (Signed)
ok 

## 2013-08-07 NOTE — Telephone Encounter (Signed)
?   OK to Refill  

## 2013-08-08 NOTE — Telephone Encounter (Signed)
rx called in

## 2013-08-15 ENCOUNTER — Other Ambulatory Visit: Payer: Self-pay | Admitting: Family Medicine

## 2013-08-15 NOTE — Telephone Encounter (Signed)
Refill appropriate and filled per protocol. 

## 2013-09-08 ENCOUNTER — Other Ambulatory Visit: Payer: Self-pay | Admitting: Family Medicine

## 2013-09-08 NOTE — Telephone Encounter (Signed)
ok 

## 2013-09-08 NOTE — Telephone Encounter (Signed)
Medication called to pharmacy. 

## 2013-09-08 NOTE — Telephone Encounter (Signed)
?   OK to Refill  

## 2013-09-15 ENCOUNTER — Other Ambulatory Visit: Payer: Self-pay | Admitting: Family Medicine

## 2013-09-15 NOTE — Telephone Encounter (Signed)
Refill appropriate and filled per protocol. 

## 2013-09-30 ENCOUNTER — Other Ambulatory Visit: Payer: Self-pay | Admitting: Family Medicine

## 2013-10-09 ENCOUNTER — Other Ambulatory Visit: Payer: Self-pay | Admitting: Family Medicine

## 2013-10-09 NOTE — Telephone Encounter (Signed)
Ok to refill??  Last office visit 04/11/2013.  Last refill 09/08/2013.

## 2013-10-09 NOTE — Telephone Encounter (Signed)
ok 

## 2013-10-10 NOTE — Telephone Encounter (Signed)
Medication called to pharmacy. 

## 2013-11-10 ENCOUNTER — Other Ambulatory Visit: Payer: Self-pay | Admitting: Family Medicine

## 2013-11-10 NOTE — Telephone Encounter (Signed)
ok 

## 2013-11-10 NOTE — Telephone Encounter (Signed)
Ok to refill??  Last office visit 04/11/2013.  Last refill 10/10/2013.

## 2013-11-10 NOTE — Telephone Encounter (Signed)
Medication called to pharmacy. 

## 2013-12-09 ENCOUNTER — Other Ambulatory Visit: Payer: Self-pay | Admitting: Family Medicine

## 2013-12-09 NOTE — Telephone Encounter (Signed)
?   OK to Refill  

## 2013-12-09 NOTE — Telephone Encounter (Signed)
ok 

## 2014-01-07 ENCOUNTER — Other Ambulatory Visit: Payer: Self-pay | Admitting: Family Medicine

## 2014-01-07 NOTE — Telephone Encounter (Signed)
?   OK to Refill  

## 2014-01-08 NOTE — Telephone Encounter (Signed)
ok 

## 2014-01-08 NOTE — Telephone Encounter (Signed)
Prescription sent to pharmacy.

## 2014-01-09 ENCOUNTER — Ambulatory Visit (INDEPENDENT_AMBULATORY_CARE_PROVIDER_SITE_OTHER): Payer: Medicare Other | Admitting: Family Medicine

## 2014-01-09 ENCOUNTER — Encounter: Payer: Self-pay | Admitting: Family Medicine

## 2014-01-09 VITALS — BP 140/80 | HR 76 | Temp 98.6°F | Resp 18 | Ht 63.0 in

## 2014-01-09 DIAGNOSIS — R5383 Other fatigue: Secondary | ICD-10-CM

## 2014-01-09 DIAGNOSIS — Z23 Encounter for immunization: Secondary | ICD-10-CM

## 2014-01-09 DIAGNOSIS — G4700 Insomnia, unspecified: Secondary | ICD-10-CM

## 2014-01-09 DIAGNOSIS — M25511 Pain in right shoulder: Secondary | ICD-10-CM

## 2014-01-09 LAB — TSH: TSH: 2.127 u[IU]/mL (ref 0.350–4.500)

## 2014-01-09 MED ORDER — TRAZODONE HCL 100 MG PO TABS: ORAL_TABLET | ORAL | Status: DC

## 2014-01-09 MED ORDER — HYDROCODONE-ACETAMINOPHEN 5-325 MG PO TABS: 1.0000 | ORAL_TABLET | Freq: Four times a day (QID) | ORAL | Status: DC | PRN

## 2014-01-09 MED ORDER — CYCLOBENZAPRINE HCL 10 MG PO TABS: ORAL_TABLET | ORAL | Status: DC

## 2014-01-09 MED ORDER — LORAZEPAM 1 MG PO TABS: ORAL_TABLET | ORAL | Status: DC

## 2014-01-09 NOTE — Addendum Note (Signed)
Addended by: Legrand RamsWILLIS, SANDY B on: 01/09/2014 11:59 AM   Modules accepted: Orders

## 2014-01-09 NOTE — Progress Notes (Signed)
Subjective:    Patient ID: Rachel ShipBrenda Steele, female    DOB: 09/10/1945, 68 y.o.   MRN: [id number]  HPI Patient is a pleasant 68 year old white female who is requesting today a refill on her trazodone. She takes 200 mg at night to help her sleep. She also takes Ativan 1 mg twice a day as needed for anxiety. These medications are working well for her. Unfortunately she continues to have pain in her neck and in her right shoulder. She is seeing orthopedics was discovered that she has a chronically torn right rotator cuff. Surgery is not an option. She also has cervical radiculopathy stemming from an old fracture in the neck. The fracture has healed but she continues to have neck pain on the right side. At the present time she is able to manage the pain at night Flexeril. She is requesting a refill on the Flexeril. Occasionally she does take hydrocodone. For instance this month she is used 2- 4 pills of hydrocodone over 30 days. She uses the medication very sparingly. She would like a refill on this medication for as needed use. She is overdue for mammogram, Pap smear, bone density, and a colonoscopy. She refuses all these at the present time but she will consider them. Her labs were last checked in May and were relatively normal. She is complaining some of fatigue. She is overdue to check a thyroid test today. She is willing to get her flu shot as well as Pneumovax 23. Past Medical History  Diagnosis Date  . Depression   . Insomnia   . Postmenopausal HRT (hormone replacement therapy)    Past Surgical History  Procedure Laterality Date  . Back surgery     Current Outpatient Prescriptions on File Prior to Visit  Medication Sig Dispense Refill  . buPROPion (WELLBUTRIN XL) 300 MG 24 hr tablet TAKE 1 TABLET BY MOUTH EVERY MORNING 30 tablet 30  . clonazePAM (KLONOPIN) 1 MG tablet TAKE 1 TABLET TWICE DAILY AS NEEDED FOR ANXIETY 60 tablet 2  . escitalopram (LEXAPRO) 20 MG tablet TAKE 1 TABLET BY MOUTH DAILY 30  tablet 5  . fish oil-omega-3 fatty acids 1000 MG capsule Take 2 g by mouth daily.    Marland Kitchen. PREMPRO 0.45-1.5 MG per tablet TAKE 1 TABLET BY MOUTH EVERY DAY 28 tablet 3  . traMADol (ULTRAM) 50 MG tablet Take 1 tablet (50 mg total) by mouth every 6 (six) hours as needed for pain. 15 tablet 0  . UNABLE TO FIND Testosterone 8% mix with cetaphil cream - Apply prn - could not find this medication in our computer - her rx # is [id number] and she can have it filled just once and at last gram as well not sure how much she gets at a time. thanks 30 g 0  . vitamin B-12 (CYANOCOBALAMIN) 1000 MCG tablet Take 1,000 mcg by mouth daily.    . vitamin C (ASCORBIC ACID) 500 MG tablet Take 500 mg by mouth daily.     No current facility-administered medications on file prior to visit.   Allergies  Allergen Reactions  . Codeine Nausea And Vomiting   History   Social History  . Marital Status: Single    Spouse Name: N/A    Number of Children: N/A  . Years of Education: N/A   Occupational History  . Not on file.   Social History Main Topics  . Smoking status: Never Smoker   . Smokeless tobacco: Not on file  . Alcohol Use: Yes  .  Drug Use: Yes    Special: Marijuana  . Sexual Activity: Yes    Birth Control/ Protection: None   Other Topics Concern  . Not on file   Social History Narrative      Review of Systems  All other systems reviewed and are negative.      Objective:   Physical Exam  Neck: No thyromegaly present.  Cardiovascular: Normal rate, regular rhythm and normal heart sounds.   Pulmonary/Chest: Effort normal and breath sounds normal. No respiratory distress. She has no wheezes. She has no rales.  Abdominal: Soft. Bowel sounds are normal. She exhibits no distension. There is no tenderness. There is no rebound and no guarding.  Musculoskeletal: She exhibits no edema.  Skin: No rash noted.  Vitals reviewed.         Assessment & Plan:  Right shoulder pain - Plan:  HYDROcodone-acetaminophen (NORCO/VICODIN) 5-325 MG per tablet, cyclobenzaprine (FLEXERIL) 10 MG tablet  Other fatigue - Plan: TSH  Insomnia - Plan: traZODone (DESYREL) 100 MG tablet  I will refill the patient's Flexeril for her shoulder pain in her neck pain. I will also give her a prescription of 30 hydrocodone to be used sparingly for shoulder pain. I did refill her Ativan to be used for anxiety and her trazodone to be used for insomnia. I will check a TSH to evaluate her fatigue. Patient received a flu shot today as well as Pneumovax 23.

## 2014-02-02 ENCOUNTER — Other Ambulatory Visit: Payer: Self-pay | Admitting: Family Medicine

## 2014-02-02 NOTE — Telephone Encounter (Signed)
Refill appropriate and filled per protocol. 

## 2014-02-05 ENCOUNTER — Other Ambulatory Visit: Payer: Self-pay | Admitting: Family Medicine

## 2014-02-05 NOTE — Telephone Encounter (Signed)
Ok to refill??  Last office visit/ refill 01/09/2014. 

## 2014-02-06 NOTE — Telephone Encounter (Signed)
Medication called to pharmacy. 

## 2014-02-06 NOTE — Telephone Encounter (Signed)
ok 

## 2014-02-23 ENCOUNTER — Telehealth: Payer: Self-pay | Admitting: Family Medicine

## 2014-02-23 DIAGNOSIS — M25511 Pain in right shoulder: Secondary | ICD-10-CM

## 2014-02-23 NOTE — Telephone Encounter (Signed)
?   OK to Refill  

## 2014-02-23 NOTE — Telephone Encounter (Signed)
[phone] PT is needing a refill on HYDROcodone-acetaminophen (NORCO/VICODIN) 5-325 MG per tablet

## 2014-02-24 MED ORDER — HYDROCODONE-ACETAMINOPHEN 5-325 MG PO TABS: 1.0000 | ORAL_TABLET | Freq: Four times a day (QID) | ORAL | Status: DC | PRN

## 2014-02-24 NOTE — Telephone Encounter (Signed)
RX printed, left up front and patient aware to pick up  

## 2014-02-24 NOTE — Telephone Encounter (Signed)
ok 

## 2014-03-09 ENCOUNTER — Other Ambulatory Visit: Payer: Self-pay | Admitting: Family Medicine

## 2014-03-09 NOTE — Telephone Encounter (Signed)
Ok with both requests.

## 2014-03-09 NOTE — Telephone Encounter (Signed)
Ok to refill Ativan??  Last office visit 01/09/2014.  Last refill 02/06/2014.    Ok to refill Trazodone for 90 day supply?

## 2014-03-10 NOTE — Telephone Encounter (Signed)
Medication called to pharmacy. 

## 2014-03-30 ENCOUNTER — Telehealth: Payer: Self-pay | Admitting: Family Medicine

## 2014-03-30 DIAGNOSIS — M25511 Pain in right shoulder: Secondary | ICD-10-CM

## 2014-03-30 NOTE — Telephone Encounter (Signed)
Patient would like refill on hydrocodone  [phone]

## 2014-03-30 NOTE — Telephone Encounter (Signed)
ok 

## 2014-03-30 NOTE — Telephone Encounter (Signed)
?   OK to Refill  

## 2014-03-31 MED ORDER — HYDROCODONE-ACETAMINOPHEN 5-325 MG PO TABS: 1.0000 | ORAL_TABLET | Freq: Four times a day (QID) | ORAL | Status: DC | PRN

## 2014-03-31 NOTE — Telephone Encounter (Signed)
RX printed, left up front and patient aware to pick up  

## 2014-04-09 ENCOUNTER — Other Ambulatory Visit: Payer: Self-pay | Admitting: Family Medicine

## 2014-04-10 NOTE — Telephone Encounter (Signed)
Medication called to pharmacy. 

## 2014-04-10 NOTE — Telephone Encounter (Deleted)
[facility]

## 2014-04-10 NOTE — Telephone Encounter (Signed)
ok 

## 2014-04-10 NOTE — Telephone Encounter (Signed)
Ok to refill??  Last office visit 01/09/2014.  Last refill Flexeril 01/09/2014, #2 refills.   Last refill 03/10/2014.

## 2014-04-23 ENCOUNTER — Other Ambulatory Visit: Payer: Self-pay | Admitting: Family Medicine

## 2014-04-23 NOTE — Telephone Encounter (Signed)
Refill appropriate and filled per protocol. 

## 2014-04-24 ENCOUNTER — Other Ambulatory Visit: Payer: Self-pay | Admitting: Family Medicine

## 2014-05-05 ENCOUNTER — Telehealth: Payer: Self-pay | Admitting: Family Medicine

## 2014-05-05 DIAGNOSIS — M25511 Pain in right shoulder: Secondary | ICD-10-CM

## 2014-05-05 MED ORDER — HYDROCODONE-ACETAMINOPHEN 5-325 MG PO TABS: 1.0000 | ORAL_TABLET | Freq: Four times a day (QID) | ORAL | Status: DC | PRN

## 2014-05-05 NOTE — Telephone Encounter (Signed)
RX printed, left up front and patient aware to pick up  

## 2014-05-05 NOTE — Telephone Encounter (Signed)
?   OK to Refill  

## 2014-05-05 NOTE — Telephone Encounter (Signed)
Patient calling for refill on hydrocodone  [phone]

## 2014-05-05 NOTE — Telephone Encounter (Signed)
ok 

## 2014-05-06 ENCOUNTER — Ambulatory Visit: Payer: Medicare Other | Admitting: Family Medicine

## 2014-05-13 ENCOUNTER — Other Ambulatory Visit: Payer: Self-pay | Admitting: Family Medicine

## 2014-05-13 NOTE — Telephone Encounter (Signed)
?   OK to Refill  

## 2014-05-14 NOTE — Telephone Encounter (Signed)
ok 

## 2014-05-15 NOTE — Telephone Encounter (Signed)
Medication called to pharmacy. 

## 2014-05-19 ENCOUNTER — Other Ambulatory Visit: Payer: Self-pay | Admitting: Family Medicine

## 2014-06-15 ENCOUNTER — Telehealth: Payer: Self-pay | Admitting: Family Medicine

## 2014-06-15 ENCOUNTER — Other Ambulatory Visit: Payer: Self-pay | Admitting: Family Medicine

## 2014-06-15 DIAGNOSIS — M25511 Pain in right shoulder: Secondary | ICD-10-CM

## 2014-06-15 NOTE — Telephone Encounter (Signed)
?   OK to Refill  

## 2014-06-15 NOTE — Telephone Encounter (Signed)
[phone]  PT is needing a refill on HYDROcodone-acetaminophen (NORCO/VICODIN) 5-325 MG per tablet (would like to pick it up tomorrow because they will be here because her husband has an apt)

## 2014-06-16 MED ORDER — HYDROCODONE-ACETAMINOPHEN 5-325 MG PO TABS: 1.0000 | ORAL_TABLET | Freq: Four times a day (QID) | ORAL | Status: DC | PRN

## 2014-06-16 NOTE — Telephone Encounter (Signed)
RX printed, left up front and patient will pick up at husbands appt today

## 2014-06-16 NOTE — Telephone Encounter (Signed)
ok 

## 2014-06-16 NOTE — Telephone Encounter (Signed)
Ok to refill??  Last office visit 01/09/2014.  Last refill 05/15/2014.

## 2014-06-17 ENCOUNTER — Other Ambulatory Visit: Payer: Self-pay | Admitting: Family Medicine

## 2014-07-20 ENCOUNTER — Other Ambulatory Visit: Payer: Self-pay | Admitting: Family Medicine

## 2014-07-20 ENCOUNTER — Telehealth: Payer: Self-pay | Admitting: Family Medicine

## 2014-07-20 DIAGNOSIS — M25511 Pain in right shoulder: Secondary | ICD-10-CM

## 2014-07-20 NOTE — Telephone Encounter (Signed)
ok 

## 2014-07-20 NOTE — Telephone Encounter (Signed)
?   OK to Refill  

## 2014-07-20 NOTE — Telephone Encounter (Signed)
Medication called to pharmacy. 

## 2014-07-20 NOTE — Telephone Encounter (Signed)
Patient call for refill of her hydrocondone  [phone]

## 2014-07-21 MED ORDER — HYDROCODONE-ACETAMINOPHEN 5-325 MG PO TABS: 1.0000 | ORAL_TABLET | Freq: Four times a day (QID) | ORAL | Status: DC | PRN

## 2014-07-21 NOTE — Telephone Encounter (Signed)
RX printed, left up front and patient aware to pick up  

## 2014-08-19 ENCOUNTER — Other Ambulatory Visit: Payer: Self-pay | Admitting: Family Medicine

## 2014-08-20 ENCOUNTER — Other Ambulatory Visit: Payer: Self-pay | Admitting: Family Medicine

## 2014-08-20 NOTE — Telephone Encounter (Signed)
?   OK to Refill  

## 2014-08-20 NOTE — Telephone Encounter (Signed)
Medication refilled per protocol. 

## 2014-08-20 NOTE — Telephone Encounter (Signed)
rx called in

## 2014-08-20 NOTE — Telephone Encounter (Signed)
ok 

## 2014-08-24 ENCOUNTER — Telehealth: Payer: Self-pay | Admitting: Family Medicine

## 2014-08-24 DIAGNOSIS — M25511 Pain in right shoulder: Secondary | ICD-10-CM

## 2014-08-24 MED ORDER — HYDROCODONE-ACETAMINOPHEN 5-325 MG PO TABS: 1.0000 | ORAL_TABLET | Freq: Four times a day (QID) | ORAL | Status: DC | PRN

## 2014-08-24 NOTE — Telephone Encounter (Signed)
Approved. May print 2 separate prescriptions--- each for #30+0.  One can be filled now and one that can be filled  09/22/14.

## 2014-08-24 NOTE — Telephone Encounter (Signed)
?   OK to Refill x 2 months - LOV 01/09/14 Last refill 07/21/2014

## 2014-08-24 NOTE — Telephone Encounter (Signed)
[phone] Pt is needing a refill on HYDROcodone-acetaminophen (NORCO/VICODIN) 5-325 MG per tablet (pt would like to have a 2 month rx instead of the one month so it would save her gas and time coming over here)

## 2014-08-24 NOTE — Telephone Encounter (Signed)
Medication called/sent to requested pharmacy  

## 2014-08-24 NOTE — Telephone Encounter (Signed)
RX printed, left up front and patient aware to pick up  

## 2014-09-18 ENCOUNTER — Other Ambulatory Visit: Payer: Self-pay | Admitting: Family Medicine

## 2014-09-18 NOTE — Telephone Encounter (Signed)
LRF Lorazepam 08/20/14 #60  LRF cyclobenzaprine 08/20/14 #30  LOV 01/09/14  OK refills?

## 2014-09-18 NOTE — Telephone Encounter (Signed)
Medication refilled per protocol. 

## 2014-09-18 NOTE — Telephone Encounter (Signed)
ok 

## 2014-10-19 ENCOUNTER — Other Ambulatory Visit: Payer: Self-pay | Admitting: Family Medicine

## 2014-10-19 NOTE — Telephone Encounter (Signed)
Medication called to pharmacy. 

## 2014-10-19 NOTE — Telephone Encounter (Signed)
ok 

## 2014-10-19 NOTE — Telephone Encounter (Signed)
Ok to refill??  Last office visit 01/09/2014.  Last refill 09/18/2014 for both.

## 2014-10-26 ENCOUNTER — Telehealth: Payer: Self-pay | Admitting: Family Medicine

## 2014-10-26 DIAGNOSIS — M25511 Pain in right shoulder: Secondary | ICD-10-CM

## 2014-10-26 NOTE — Telephone Encounter (Signed)
PATIENT IS REQUESTING RX FOR HYDROCODONE AND IF POSSIBLE FOR 2 MONTH SUPPLY, SHE LIVES IN [place] ITS A LONG WAY TO DRIVE  [phone] WHEN READY

## 2014-10-26 NOTE — Telephone Encounter (Signed)
?  ok to refill, pt is requesting for 2 month supply

## 2014-10-27 MED ORDER — HYDROCODONE-ACETAMINOPHEN 5-325 MG PO TABS: 1.0000 | ORAL_TABLET | Freq: Four times a day (QID) | ORAL | Status: DC | PRN

## 2014-10-27 NOTE — Telephone Encounter (Signed)
Is it okay for me to fill it for 2 months?

## 2014-10-27 NOTE — Telephone Encounter (Signed)
ok 

## 2014-10-27 NOTE — Telephone Encounter (Signed)
Per provider ok to refill for 2 months, I printed on two different rx sheets, ready for provider signature

## 2014-11-18 ENCOUNTER — Other Ambulatory Visit: Payer: Self-pay | Admitting: Family Medicine

## 2014-11-18 NOTE — Telephone Encounter (Signed)
?   OK to Refill  

## 2014-11-19 NOTE — Telephone Encounter (Signed)
ok 

## 2014-12-12 ENCOUNTER — Other Ambulatory Visit: Payer: Self-pay | Admitting: Family Medicine

## 2014-12-14 ENCOUNTER — Other Ambulatory Visit: Payer: Self-pay | Admitting: Family Medicine

## 2014-12-17 ENCOUNTER — Other Ambulatory Visit: Payer: Self-pay | Admitting: Family Medicine

## 2014-12-17 NOTE — Telephone Encounter (Signed)
ok 

## 2014-12-17 NOTE — Telephone Encounter (Signed)
Ok to refill 

## 2014-12-17 NOTE — Telephone Encounter (Signed)
Prescription sent to pharmacy.

## 2014-12-25 ENCOUNTER — Telehealth: Payer: Self-pay | Admitting: Family Medicine

## 2014-12-25 DIAGNOSIS — M25511 Pain in right shoulder: Secondary | ICD-10-CM

## 2014-12-25 MED ORDER — HYDROCODONE-ACETAMINOPHEN 5-325 MG PO TABS: 1.0000 | ORAL_TABLET | Freq: Four times a day (QID) | ORAL | Status: DC | PRN

## 2014-12-25 NOTE — Telephone Encounter (Signed)
Pain is getting worse and has needed to take more then one a day.

## 2014-12-25 NOTE — Telephone Encounter (Signed)
RX printed and left pt message Rx ready.

## 2014-12-25 NOTE — Telephone Encounter (Signed)
ok 

## 2014-12-25 NOTE — Telephone Encounter (Signed)
PATIENT CALLING TO GET RX FOR HER HYDROCODONE, AND WOULD LIKE TO KNOW IF SHE CAN GET #60 BECAUSE SHE NEEDS TO TAKE MORE THAN ONE A DAY  [phone]

## 2014-12-25 NOTE — Telephone Encounter (Signed)
Call placed to patient to inquire as to why greater quantity needed.   LMTRC.

## 2014-12-28 ENCOUNTER — Telehealth: Payer: Self-pay | Admitting: Family Medicine

## 2014-12-28 DIAGNOSIS — M25511 Pain in right shoulder: Secondary | ICD-10-CM

## 2014-12-28 NOTE — Telephone Encounter (Signed)
Pt is planning on picking up her hydrocodone prescription tomorrow and would like to know if we can go ahead and write her December script so that she will not have to make another trip. [phone]

## 2014-12-28 NOTE — Telephone Encounter (Signed)
OK to do-

## 2014-12-29 MED ORDER — HYDROCODONE-ACETAMINOPHEN 5-325 MG PO TABS: 1.0000 | ORAL_TABLET | Freq: Four times a day (QID) | ORAL | Status: DC | PRN

## 2014-12-29 NOTE — Telephone Encounter (Signed)
RX printed, left up front  

## 2014-12-29 NOTE — Telephone Encounter (Signed)
ok 

## 2015-01-02 ENCOUNTER — Other Ambulatory Visit: Payer: Self-pay | Admitting: Family Medicine

## 2015-01-04 ENCOUNTER — Encounter: Payer: Self-pay | Admitting: Family Medicine

## 2015-01-04 NOTE — Telephone Encounter (Signed)
Medication refill for one time only.  Patient needs to be seen.  Letter sent for patient to call and schedule 

## 2015-01-11 ENCOUNTER — Other Ambulatory Visit: Payer: Self-pay | Admitting: Family Medicine

## 2015-01-25 ENCOUNTER — Other Ambulatory Visit: Payer: Self-pay | Admitting: Family Medicine

## 2015-01-25 NOTE — Telephone Encounter (Signed)
Prescription sent to pharmacy.

## 2015-01-25 NOTE — Telephone Encounter (Signed)
?   OK to Refill  

## 2015-01-25 NOTE — Telephone Encounter (Signed)
ok 

## 2015-02-10 ENCOUNTER — Other Ambulatory Visit: Payer: Self-pay | Admitting: Family Medicine

## 2015-02-11 ENCOUNTER — Encounter: Payer: Self-pay | Admitting: Family Medicine

## 2015-02-11 ENCOUNTER — Ambulatory Visit (INDEPENDENT_AMBULATORY_CARE_PROVIDER_SITE_OTHER): Payer: Medicare Other | Admitting: Family Medicine

## 2015-02-11 VITALS — BP 180/100 | HR 88 | Temp 98.2°F | Resp 16

## 2015-02-11 DIAGNOSIS — M25511 Pain in right shoulder: Secondary | ICD-10-CM | POA: Diagnosis not present

## 2015-02-11 DIAGNOSIS — F329 Major depressive disorder, single episode, unspecified: Secondary | ICD-10-CM

## 2015-02-11 DIAGNOSIS — F32A Depression, unspecified: Secondary | ICD-10-CM

## 2015-02-11 MED ORDER — HYDROCODONE-ACETAMINOPHEN 5-325 MG PO TABS: 1.0000 | ORAL_TABLET | Freq: Four times a day (QID) | ORAL | Status: DC | PRN

## 2015-02-11 MED ORDER — LORAZEPAM 1 MG PO TABS: 1.0000 mg | ORAL_TABLET | Freq: Three times a day (TID) | ORAL | Status: DC | PRN

## 2015-02-11 NOTE — Progress Notes (Signed)
Subjective:    Patient ID: Rachel Kaufman, female    DOB: 02/02/1946, 69 y.o.   MRN: [id number]  HPI  The patient's husband died from septic emboli due to bacteremia from endocarditis and septic arthritis earlier this year. Since that time, she has experienced uncontrolled depression and anxiety. Today he she is crying uncontrollably. She is extremely anxious. Her blood pressure is extremely elevated and has never been so in my office but I believe this is due to her uncontrolled crying.  She is extremely depressed. She states Sunday she wished she could just die although she admits she is not suicidal. She is unable to sleep. She is unable to concentrate. She constantly has crying spells every day just at the thought of her husband. In the past she has tried Prozac which did not work. She is also tried Effexor which did not work for her. She is currently on Wellbutrin in addition to Lexapro and is also using Ativan 1 mg twice a day however the Ativan only gives her a few hours of relief from the uncontrollable anxiety and sadness. Past Medical History  Diagnosis Date  . Depression   . Insomnia   . Postmenopausal HRT (hormone replacement therapy)    Past Surgical History  Procedure Laterality Date  . Back surgery     Current Outpatient Prescriptions on File Prior to Visit  Medication Sig Dispense Refill  . buPROPion (WELLBUTRIN XL) 300 MG 24 hr tablet TAKE 1 TABLET BY MOUTH EVERY MORNING 30 tablet 11  . cyclobenzaprine (FLEXERIL) 10 MG tablet TAKE 1 TABLET BY MOUTH THREE TIMES DAILY( EVERY EIGHT HOURS) AS NEEDED 30 tablet 0  . escitalopram (LEXAPRO) 20 MG tablet TAKE 1 TABLET BY MOUTH DAILY 30 tablet 11  . fish oil-omega-3 fatty acids 1000 MG capsule Take 2 g by mouth daily.    Marland Kitchen. PREMPRO 0.45-1.5 MG tablet TAKE 1 TABLET BY MOUTH EVERY DAY 28 tablet 11  . traZODone (DESYREL) 100 MG tablet TAKE 2 TABLET BY MOUTH EVERY NIGHT AT BEDTIME 60 tablet 2  . traZODone (DESYREL) 100 MG tablet TAKE 2  TABLETS BY MOUTH EVERY NIGHT AT BEDTIME 60 tablet 2  . UNABLE TO FIND Testosterone 8% mix with cetaphil cream - Apply prn - could not find this medication in our computer - her rx # is [id number] and she can have it filled just once and at last gram as well not sure how much she gets at a time. thanks 30 g 0  . vitamin B-12 (CYANOCOBALAMIN) 1000 MCG tablet Take 1,000 mcg by mouth daily.    . vitamin C (ASCORBIC ACID) 500 MG tablet Take 500 mg by mouth daily.     No current facility-administered medications on file prior to visit.   Allergies  Allergen Reactions  . Codeine Nausea And Vomiting   Social History   Social History  . Marital Status: Single    Spouse Name: N/A  . Number of Children: N/A  . Years of Education: N/A   Occupational History  . Not on file.   Social History Main Topics  . Smoking status: Never Smoker   . Smokeless tobacco: Not on file  . Alcohol Use: Yes  . Drug Use: Yes    Special: Marijuana  . Sexual Activity: Yes    Birth Control/ Protection: None   Other Topics Concern  . Not on file   Social History Narrative   widowed  Review of Systems  All other systems reviewed and are  negative.      Objective:   Physical Exam  Cardiovascular: Normal rate, regular rhythm and normal heart sounds.   Pulmonary/Chest: Effort normal and breath sounds normal. No respiratory distress. She has no wheezes. She has no rales.  Psychiatric: Her speech is normal and behavior is normal. Judgment normal. Her mood appears anxious. Thought content is not paranoid and not delusional. Cognition and memory are normal. She exhibits a depressed mood. She expresses no homicidal and no suicidal ideation. She expresses no suicidal plans and no homicidal plans.  Vitals reviewed.         Assessment & Plan:  Depression - Plan: LORazepam (ATIVAN) 1 MG tablet  Right shoulder pain - Plan: HYDROcodone-acetaminophen (NORCO/VICODIN) 5-325 MG tablet, DISCONTINUED:  HYDROcodone-acetaminophen (NORCO/VICODIN) 5-325 MG tablet, DISCONTINUED: HYDROcodone-acetaminophen (NORCO/VICODIN) 5-325 MG tablet  Patient's depression is uncontrolled. 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 Decrease Lexapro to 10 mg a day for 1 week and then to 10 mg every other day for 1 week and then stop. Begin trintellix 5 mg poqday for 2 weeks and then increase to 10 mg a day. Recheck in one month. I will temporarily increase her Ativan to 1 mg 3 times a day however hopefully once her depression is better we can decrease this back. Also refill her chronic pain medication which she takes for shoulder pain and back pain.  Patient denies any suicidal ideation

## 2015-02-13 ENCOUNTER — Other Ambulatory Visit: Payer: Self-pay | Admitting: Family Medicine

## 2015-03-01 ENCOUNTER — Other Ambulatory Visit: Payer: Self-pay | Admitting: Family Medicine

## 2015-03-02 NOTE — Telephone Encounter (Signed)
Prescription sent to pharmacy.

## 2015-03-02 NOTE — Telephone Encounter (Signed)
ok 

## 2015-03-02 NOTE — Telephone Encounter (Signed)
?   OK to Refill  

## 2015-03-11 ENCOUNTER — Other Ambulatory Visit: Payer: Self-pay | Admitting: Family Medicine

## 2015-03-11 ENCOUNTER — Telehealth: Payer: Self-pay | Admitting: Family Medicine

## 2015-03-11 MED ORDER — VORTIOXETINE HBR 10 MG PO TABS: 10.0000 mg | ORAL_TABLET | Freq: Every day | ORAL | Status: DC

## 2015-03-11 NOTE — Telephone Encounter (Signed)
Pt is calling for a rx of trintlellix to be called in to the pharmacy. Dr. Tanya NonesPickard had given her samples and they seem to be working.   you can reach her @ [phone]

## 2015-03-11 NOTE — Telephone Encounter (Signed)
ok 

## 2015-03-11 NOTE — Telephone Encounter (Signed)
Medication called/sent to pharmacy  

## 2015-03-11 NOTE — Telephone Encounter (Signed)
Ok to refill??  Last office visit/ refill 02/11/2015.

## 2015-03-11 NOTE — Telephone Encounter (Signed)
Medication called to pharmacy. 

## 2015-04-08 ENCOUNTER — Other Ambulatory Visit: Payer: Self-pay | Admitting: Family Medicine

## 2015-04-08 NOTE — Telephone Encounter (Signed)
ok 

## 2015-04-08 NOTE — Telephone Encounter (Signed)
Medication called to pharmacy. 

## 2015-04-08 NOTE — Telephone Encounter (Signed)
Ok to refill??  Last office visit 02/11/2015.  Last refill Ativan 03/11/2015.  Last refill Flexeril 03/02/2015.

## 2015-05-04 ENCOUNTER — Other Ambulatory Visit: Payer: Self-pay | Admitting: Family Medicine

## 2015-05-04 NOTE — Telephone Encounter (Signed)
Medication called to pharmacy. 

## 2015-05-04 NOTE — Telephone Encounter (Signed)
ok 

## 2015-05-04 NOTE — Telephone Encounter (Signed)
Ok to refill??  Last office visit 02/11/2015.  Last refill 04/08/2015.

## 2015-05-14 ENCOUNTER — Ambulatory Visit (INDEPENDENT_AMBULATORY_CARE_PROVIDER_SITE_OTHER): Payer: Medicare Other | Admitting: Family Medicine

## 2015-05-14 ENCOUNTER — Encounter: Payer: Self-pay | Admitting: Family Medicine

## 2015-05-14 VITALS — BP 170/110 | HR 82 | Temp 98.0°F | Resp 18 | Wt 116.0 lb

## 2015-05-14 DIAGNOSIS — R030 Elevated blood-pressure reading, without diagnosis of hypertension: Secondary | ICD-10-CM

## 2015-05-14 DIAGNOSIS — M25511 Pain in right shoulder: Secondary | ICD-10-CM | POA: Diagnosis not present

## 2015-05-14 DIAGNOSIS — F329 Major depressive disorder, single episode, unspecified: Secondary | ICD-10-CM

## 2015-05-14 DIAGNOSIS — IMO0001 Reserved for inherently not codable concepts without codable children: Secondary | ICD-10-CM

## 2015-05-14 DIAGNOSIS — F32A Depression, unspecified: Secondary | ICD-10-CM

## 2015-05-14 MED ORDER — OXYCODONE-ACETAMINOPHEN 10-325 MG PO TABS: 1.0000 | ORAL_TABLET | ORAL | Status: DC | PRN

## 2015-05-14 NOTE — Progress Notes (Signed)
Subjective:    Patient ID: Rachel Kaufman, female    DOB: August 02, 1945, 70 y.o.   MRN: [id number]  HPI 12/2013 Patient is a pleasant 70 year old white female who is requesting today a refill on her trazodone. She takes 200 mg at night to help her sleep. She also takes Ativan 1 mg twice a day as needed for anxiety. These medications are working well for her. Unfortunately she continues to have pain in her neck and in her right shoulder. She is seeing orthopedics was discovered that she has a chronically torn right rotator cuff. Surgery is not an option. She also has cervical radiculopathy stemming from an old fracture in the neck. The fracture has healed but she continues to have neck pain on the right side. At the present time she is able to manage the pain at night Flexeril. She is requesting a refill on the Flexeril. Occasionally she does take hydrocodone. For instance this month she is used 2- 4 pills of hydrocodone over 30 days. She uses the medication very sparingly. She would like a refill on this medication for as needed use. She is overdue for mammogram, Pap smear, bone density, and a colonoscopy. She refuses all these at the present time but she will consider them. Her labs were last checked in May and were relatively normal. She is complaining some of fatigue. She is overdue to check a thyroid test today. She is willing to get her flu shot as well as Pneumovax 23.  At that time, my plan was: I will refill the patient's Flexeril for her shoulder pain in her neck pain. I will also give her a prescription of 30 hydrocodone to be used sparingly for shoulder pain. I did refill her Ativan to be used for anxiety and her trazodone to be used for insomnia. I will check a TSH to evaluate her fatigue. Patient received a flu shot today as well as Pneumovax 23.  05/14/15 Patient is back today complaining of pain in her right shoulder. Pain is worse with abduction greater than 90. She has significant pain with  empty can testing. She has a negative Spurling sign. She has pain with internal and external rotation. She has a positive Hawkins sign. She did have a CT scan of the neck that showed moderate to severe degenerative disc disease at C5-C6 and C6-C7 with possible right-sided nerve impingement at both levels however the strength in her arm is normal, reflexes are normal, and she denies any neuropathic symptoms such as numbness or tingling. Therefore I believe the pain in her shoulder is due to her rotator cuff. The patient states she saw Dr. [place] Blas in the past and she does have a chronic tear in her "rotator cuff". He recommended medical management rather than surgery. However she is been taking hydrocodone without relief. She is here today crying due to the pain. Her blood pressure is significantly elevated however she states that she is severely depressed. Please see my last office visit. At that time I discontinued Lexapro and switch the patient toTrintellix.  However she is seen very little benefit for this. This stems from the loss of her husband. She is still unable to cover from her sadness and mourning.  However she does not want to try any medicine that may cause weight gain Past Medical History  Diagnosis Date  . Depression   . Insomnia   . Postmenopausal HRT (hormone replacement therapy)    Past Surgical History  Procedure Laterality Date  .  Back surgery     Current Outpatient Prescriptions on File Prior to Visit  Medication Sig Dispense Refill  . buPROPion (WELLBUTRIN XL) 300 MG 24 hr tablet TAKE 1 TABLET BY MOUTH EVERY MORNING 30 tablet 11  . cyclobenzaprine (FLEXERIL) 10 MG tablet TAKE 1 TABLET BY MOUTH THREE TIMES DAILY( EVERY EIGHT HOURS) AS NEEDED 30 tablet 0  . fish oil-omega-3 fatty acids 1000 MG capsule Take 2 g by mouth daily.    Marland Kitchen HYDROcodone-acetaminophen (NORCO/VICODIN) 5-325 MG tablet Take 1 tablet by mouth every 6 (six) hours as needed for moderate pain. 60 tablet 0  .  LORazepam (ATIVAN) 1 MG tablet TAKE 1 TABLET BY MOUTH EVERY 8 HOURS AS NEEDED FOR ANXIETY 90 tablet 0  . PREMPRO 0.45-1.5 MG tablet TAKE 1 TABLET BY MOUTH EVERY DAY 28 tablet 11  . traZODone (DESYREL) 100 MG tablet TAKE 2 TABLET BY MOUTH EVERY NIGHT AT BEDTIME 60 tablet 2  . UNABLE TO FIND Testosterone 8% mix with cetaphil cream - Apply prn - could not find this medication in our computer - her rx # is [id number] and she can have it filled just once and at last gram as well not sure how much she gets at a time. thanks 30 g 0  . vitamin B-12 (CYANOCOBALAMIN) 1000 MCG tablet Take 1,000 mcg by mouth daily.    . vitamin C (ASCORBIC ACID) 500 MG tablet Take 500 mg by mouth daily.    . Vortioxetine HBr (TRINTELLIX) 10 MG TABS Take 1 tablet (10 mg total) by mouth daily. 30 tablet 5   No current facility-administered medications on file prior to visit.   Allergies  Allergen Reactions  . Codeine Nausea And Vomiting   Social History   Social History  . Marital Status: Single    Spouse Name: N/A  . Number of Children: N/A  . Years of Education: N/A   Occupational History  . Not on file.   Social History Main Topics  . Smoking status: Never Smoker   . Smokeless tobacco: Not on file  . Alcohol Use: Yes  . Drug Use: Yes    Special: Marijuana  . Sexual Activity: Yes    Birth Control/ Protection: None   Other Topics Concern  . Not on file   Social History Narrative      Review of Systems  All other systems reviewed and are negative.      Objective:   Physical Exam  Neck: No thyromegaly present.  Cardiovascular: Normal rate, regular rhythm and normal heart sounds.   Pulmonary/Chest: Effort normal and breath sounds normal. No respiratory distress. She has no wheezes. She has no rales.  Abdominal: Soft. Bowel sounds are normal. She exhibits no distension. There is no tenderness. There is no rebound and no guarding.  Musculoskeletal: She exhibits no edema.       Right shoulder: She  exhibits decreased range of motion, tenderness, pain and decreased strength. She exhibits no crepitus.  Skin: No rash noted.  Vitals reviewed.         Assessment & Plan:  Right shoulder pain - Plan: oxyCODONE-acetaminophen (PERCOCET) 10-325 MG tablet  Depression  Elevated BP  Patient does not want to treat her blood pressure today. She believes is due to pain as well as anxiety and sadness. Therefore I will treat the pain is best I can. Discontinue hydrocodone and switch to Percocet 10/325 one by mouth every 6 hours when necessary pain. I gave her 69. This is temporary. Hopefully  the cortisone injection I performed today and her shoulder will help ease the pain somewhat so she will not need this and we can resume her previous dose of hydrocodone. Using sterile technique, I injected the right shoulder with 2 mL of lidocaine, 2 mL of Marcaine, and 2 mL of 40 mg per mL Kenalog. The patient tolerated the procedure well without complication. I asked her to call the back next week. I do not want to change too many medications at one time but if she is tolerating the new pain medication, I will switch the patient back to Lexapro and discontinue Trintellix.  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

## 2015-05-18 ENCOUNTER — Other Ambulatory Visit: Payer: Self-pay | Admitting: Family Medicine

## 2015-05-18 NOTE — Telephone Encounter (Signed)
Medication refilled per protocol. 

## 2015-06-01 ENCOUNTER — Other Ambulatory Visit: Payer: Self-pay | Admitting: Family Medicine

## 2015-06-01 NOTE — Telephone Encounter (Signed)
Ok to refill??  Last office visit 05/14/2015.  Last refill 05/04/2015. 

## 2015-06-03 NOTE — Telephone Encounter (Signed)
Medication called to pharmacy. 

## 2015-06-03 NOTE — Telephone Encounter (Signed)
ok 

## 2015-06-04 ENCOUNTER — Telehealth: Payer: Self-pay | Admitting: Family Medicine

## 2015-06-04 DIAGNOSIS — M25511 Pain in right shoulder: Secondary | ICD-10-CM

## 2015-06-04 NOTE — Telephone Encounter (Signed)
?   OK to Refill  

## 2015-06-04 NOTE — Telephone Encounter (Signed)
oxyCODONE-acetaminophen (PERCOCET) 10-325 MG tablet  CB # [phone]

## 2015-06-07 ENCOUNTER — Telehealth: Payer: Self-pay | Admitting: Family Medicine

## 2015-06-07 MED ORDER — OXYCODONE-ACETAMINOPHEN 10-325 MG PO TABS: 1.0000 | ORAL_TABLET | ORAL | Status: DC | PRN

## 2015-06-07 NOTE — Telephone Encounter (Signed)
ok 

## 2015-06-07 NOTE — Telephone Encounter (Signed)
Pt left a VM requesting Oxycodone or Hydrocodone, whichever one Dr. Tanya NonesPickard will give her. [phone]

## 2015-06-07 NOTE — Telephone Encounter (Signed)
RX printed, left up front and patient aware to pick up  

## 2015-06-08 NOTE — Telephone Encounter (Signed)
rx from 06/04/15 is still up front and pt aware to come pick up (again)

## 2015-06-28 ENCOUNTER — Telehealth: Payer: Self-pay | Admitting: Family Medicine

## 2015-06-28 DIAGNOSIS — M25511 Pain in right shoulder: Secondary | ICD-10-CM

## 2015-06-28 NOTE — Telephone Encounter (Signed)
Pt needs a refill of Oxycodone 10-325 [id number]

## 2015-06-28 NOTE — Telephone Encounter (Signed)
Ok to refill 

## 2015-06-29 ENCOUNTER — Other Ambulatory Visit: Payer: Self-pay | Admitting: Family Medicine

## 2015-06-29 MED ORDER — HYDROCODONE-ACETAMINOPHEN 5-325 MG PO TABS: 1.0000 | ORAL_TABLET | Freq: Four times a day (QID) | ORAL | Status: DC | PRN

## 2015-06-29 NOTE — Telephone Encounter (Signed)
Percocet was temporary, see my last note.  Switch back to previous dose of hydrocodone.  I also wanted to see her back to discuss abilify (see ov).  She needs to schedule this.

## 2015-06-29 NOTE — Telephone Encounter (Signed)
RX printed for hydrocodone, left up front and patient aware to pick up - Patient aware of providers recommendations and appt made

## 2015-06-29 NOTE — Telephone Encounter (Signed)
Refill appropriate and filled per protocol. 

## 2015-07-01 ENCOUNTER — Other Ambulatory Visit: Payer: Self-pay | Admitting: Family Medicine

## 2015-07-01 NOTE — Telephone Encounter (Signed)
Ok to refill??  Last office visit 05/14/2015.  Last refill 06/03/2015.

## 2015-07-01 NOTE — Telephone Encounter (Signed)
ok 

## 2015-07-02 ENCOUNTER — Ambulatory Visit (INDEPENDENT_AMBULATORY_CARE_PROVIDER_SITE_OTHER): Payer: Medicare Other | Admitting: Family Medicine

## 2015-07-02 ENCOUNTER — Encounter: Payer: Self-pay | Admitting: Family Medicine

## 2015-07-02 VITALS — BP 140/92 | HR 86 | Temp 98.1°F | Resp 16 | Ht 63.0 in | Wt 109.0 lb

## 2015-07-02 DIAGNOSIS — I10 Essential (primary) hypertension: Secondary | ICD-10-CM

## 2015-07-02 DIAGNOSIS — F329 Major depressive disorder, single episode, unspecified: Secondary | ICD-10-CM | POA: Diagnosis not present

## 2015-07-02 DIAGNOSIS — M25511 Pain in right shoulder: Secondary | ICD-10-CM

## 2015-07-02 DIAGNOSIS — F32A Depression, unspecified: Secondary | ICD-10-CM

## 2015-07-02 MED ORDER — LOSARTAN POTASSIUM 50 MG PO TABS: 50.0000 mg | ORAL_TABLET | Freq: Every day | ORAL | Status: DC

## 2015-07-02 NOTE — Telephone Encounter (Signed)
Medication called to pharmacy. 

## 2015-07-02 NOTE — Progress Notes (Signed)
Subjective:    Patient ID: Rachel Kaufman, female    DOB: 1945/05/03, 70 y.o.   MRN: [id number]  HPI 12/2013 Patient is a pleasant 70 year old white female who is requesting today a refill on her trazodone. She takes 200 mg at night to help her sleep. She also takes Ativan 1 mg twice a day as needed for anxiety. These medications are working well for her. Unfortunately she continues to have pain in her neck and in her right shoulder. She is seeing orthopedics was discovered that she has a chronically torn right rotator cuff. Surgery is not an option. She also has cervical radiculopathy stemming from an old fracture in the neck. The fracture has healed but she continues to have neck pain on the right side. At the present time she is able to manage the pain at night Flexeril. She is requesting a refill on the Flexeril. Occasionally she does take hydrocodone. For instance this month she is used 2- 4 pills of hydrocodone over 30 days. She uses the medication very sparingly. She would like a refill on this medication for as needed use. She is overdue for mammogram, Pap smear, bone density, and a colonoscopy. She refuses all these at the present time but she will consider them. Her labs were last checked in May and were relatively normal. She is complaining some of fatigue. She is overdue to check a thyroid test today. She is willing to get her flu shot as well as Pneumovax 23.  At that time, my plan was: I will refill the patient's Flexeril for her shoulder pain in her neck pain. I will also give her a prescription of 30 hydrocodone to be used sparingly for shoulder pain. I did refill her Ativan to be used for anxiety and her trazodone to be used for insomnia. I will check a TSH to evaluate her fatigue. Patient received a flu shot today as well as Pneumovax 23.  05/14/15 Patient is back today complaining of pain in her right shoulder. Pain is worse with abduction greater than 90. She has significant pain with  empty can testing. She has a negative Spurling sign. She has pain with internal and external rotation. She has a positive Hawkins sign. She did have a CT scan of the neck that showed moderate to severe degenerative disc disease at C5-C6 and C6-C7 with possible right-sided nerve impingement at both levels however the strength in her arm is normal, reflexes are normal, and she denies any neuropathic symptoms such as numbness or tingling. Therefore I believe the pain in her shoulder is due to her rotator cuff. The patient states she saw Dr. [place] Blas in the past and she does have a chronic tear in her "rotator cuff". He recommended medical management rather than surgery. However she is been taking hydrocodone without relief. She is here today crying due to the pain. Her blood pressure is significantly elevated however she states that she is severely depressed. Please see my last office visit. At that time I discontinued Lexapro and switch the patient toTrintellix.  However she is seen very little benefit for this. This stems from the loss of her husband. She is still unable to cover from her sadness and mourning.  However she does not want to try any medicine that may cause weight gain.  At that time, my plan was: Patient does not want to treat her blood pressure today. She believes is due to pain as well as anxiety and sadness. Therefore I will treat  the pain is best I can. Discontinue hydrocodone and switch to Percocet 10/325 one by mouth every 6 hours when necessary pain. I gave her 860. This is temporary. Hopefully the cortisone injection I performed today and her shoulder will help ease the pain somewhat so she will not need this and we can resume her previous dose of hydrocodone. Using sterile technique, I injected the right shoulder with 2 mL of lidocaine, 2 mL of Marcaine, and 2 mL of 40 mg per mL Kenalog. The patient tolerated the procedure well without complication. I asked her to call the back next week. I do  not want to change too many medications at one time but if she is tolerating the new pain medication, I will switch the patient back to Lexapro and discontinue Trintellix.  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 She continues to have pain in her shoulder. I refused to continue to refill the Percocet. I recommended that she resume her previous dose of hydrocodone twice daily to avoid increasing dependence to stronger and stronger narcotics. However she states that her depression is doing much better on Trintellix and she now has no desire to switch Past Medical History  Diagnosis Date  . Depression   . Insomnia   . Postmenopausal HRT (hormone replacement therapy)    Past Surgical History  Procedure Laterality Date  . Back surgery     Current Outpatient Prescriptions on File Prior to Visit  Medication Sig Dispense Refill  . buPROPion (WELLBUTRIN XL) 300 MG 24 hr tablet TAKE 1 TABLET BY MOUTH EVERY MORNING 90 tablet 1  . cyclobenzaprine (FLEXERIL) 10 MG tablet TAKE 1 TABLET BY MOUTH THREE TIMES DAILY( EVERY EIGHT HOURS) AS NEEDED 30 tablet 0  . fish oil-omega-3 fatty acids 1000 MG capsule Take 2 g by mouth daily.    Marland Kitchen. HYDROcodone-acetaminophen (NORCO/VICODIN) 5-325 MG tablet Take 1 tablet by mouth every 6 (six) hours as needed for moderate pain. 60 tablet 0  . LORazepam (ATIVAN) 1 MG tablet TAKE ONE TABLET BY MOUTH EVERY 8 HOURS AS NEEDED FOR ANXIETY 90 tablet 0  . oxyCODONE-acetaminophen (PERCOCET) 10-325 MG tablet Take 1 tablet by mouth every 4 (four) hours as needed for pain. 60 tablet 0  . PREMPRO 0.45-1.5 MG tablet TAKE 1 TABLET BY MOUTH EVERY DAY 28 tablet 11  . traZODone (DESYREL) 100 MG tablet TAKE 2 TABLETS BY MOUTH EVERY NIGHT AT BEDTIME 60 tablet 0  . UNABLE TO FIND Testosterone 8% mix with cetaphil cream - Apply prn -  could not find this medication in our computer - her rx # is [id number] and she can have it filled just once and at last gram as well not sure how much she gets at a time. thanks 30 g 0  . vitamin B-12 (CYANOCOBALAMIN) 1000 MCG tablet Take 1,000 mcg by mouth daily.    . vitamin C (ASCORBIC ACID) 500 MG tablet Take 500 mg by mouth daily.    . Vortioxetine HBr (TRINTELLIX) 10 MG TABS Take 1 tablet (10 mg total) by mouth daily. 30 tablet 5   No current facility-administered medications on file prior to visit.   Allergies  Allergen Reactions  . Codeine Nausea And Vomiting   Social History   Social History  . Marital Status: Single    Spouse Name: N/A  . Number of  Children: N/A  . Years of Education: N/A   Occupational History  . Not on file.   Social History Main Topics  . Smoking status: Never Smoker   . Smokeless tobacco: Not on file  . Alcohol Use: Yes  . Drug Use: Yes    Special: Marijuana  . Sexual Activity: Yes    Birth Control/ Protection: None   Other Topics Concern  . Not on file   Social History Narrative      Review of Systems  All other systems reviewed and are negative.      Objective:   Physical Exam  Neck: No thyromegaly present.  Cardiovascular: Normal rate, regular rhythm and normal heart sounds.   Pulmonary/Chest: Effort normal and breath sounds normal. No respiratory distress. She has no wheezes. She has no rales.  Abdominal: Soft. Bowel sounds are normal. She exhibits no distension. There is no tenderness. There is no rebound and no guarding.  Musculoskeletal: She exhibits no edema.       Right shoulder: She exhibits decreased range of motion, tenderness, pain and decreased strength. She exhibits no crepitus.  Skin: No rash noted.  Vitals reviewed.         Assessment & Plan:  Benign essential HTN - Plan: losartan (COZAAR) 50 MG tablet  Depression  Right shoulder pain  Continue Trintellix 10 mg poqday.  Add losartan 50 mg by mouth daily  and recheck blood pressure in one month. Continue hydrocodone/acetaminophen 5/325 one by mouth twice a day 60 tablets per month for right shoulder pain. The pain worsens I will recommend a repeat for consultation versus a pain clinic

## 2015-07-29 ENCOUNTER — Telehealth: Payer: Self-pay | Admitting: Family Medicine

## 2015-07-29 DIAGNOSIS — M25511 Pain in right shoulder: Secondary | ICD-10-CM

## 2015-07-29 MED ORDER — HYDROCODONE-ACETAMINOPHEN 5-325 MG PO TABS: 1.0000 | ORAL_TABLET | Freq: Four times a day (QID) | ORAL | Status: DC | PRN

## 2015-07-29 NOTE — Telephone Encounter (Signed)
Last refill:06/29/15 Last office visit : 07/02/15 Okay to refill?

## 2015-07-29 NOTE — Telephone Encounter (Signed)
Ok with both

## 2015-07-29 NOTE — Telephone Encounter (Signed)
Patient is requesting a prescription of her  HYDROcodone-acetaminophen (NORCO/VICODIN) 5-325 MG tablet she would like this months and next months refill. She is requesting two months due to her drive.  CB# [phone]

## 2015-07-29 NOTE — Telephone Encounter (Signed)
Prescription printed and patient made aware to come to office to pick up on 07/30/2015 per VM.  

## 2015-08-02 ENCOUNTER — Telehealth: Payer: Self-pay

## 2015-08-02 NOTE — Telephone Encounter (Signed)
Okay to refill? 

## 2015-08-02 NOTE — Telephone Encounter (Signed)
Lorazepam refill request.  Last filled 07/02/2015, last seen 07/02/2015.  Please advise.

## 2015-08-03 MED ORDER — LORAZEPAM 1 MG PO TABS: 1.0000 mg | ORAL_TABLET | Freq: Three times a day (TID) | ORAL | Status: DC | PRN

## 2015-08-03 NOTE — Telephone Encounter (Signed)
Lorazepam called into [facility]. 

## 2015-08-21 ENCOUNTER — Other Ambulatory Visit: Payer: Self-pay | Admitting: Family Medicine

## 2015-08-23 ENCOUNTER — Other Ambulatory Visit: Payer: Self-pay | Admitting: Family Medicine

## 2015-08-25 ENCOUNTER — Other Ambulatory Visit: Payer: Self-pay | Admitting: Family Medicine

## 2015-08-27 ENCOUNTER — Other Ambulatory Visit: Payer: Self-pay | Admitting: Family Medicine

## 2015-08-27 NOTE — Telephone Encounter (Signed)
Ok to refill??  Last office visit 07/02/2015.  Last refill 08/03/2015.

## 2015-08-30 NOTE — Telephone Encounter (Signed)
ok 

## 2015-09-21 ENCOUNTER — Other Ambulatory Visit: Payer: Self-pay | Admitting: Family Medicine

## 2015-09-21 NOTE — Telephone Encounter (Signed)
Ok to refill 

## 2015-09-21 NOTE — Telephone Encounter (Signed)
ok 

## 2015-09-23 ENCOUNTER — Telehealth: Payer: Self-pay | Admitting: Family Medicine

## 2015-09-23 DIAGNOSIS — M25511 Pain in right shoulder: Secondary | ICD-10-CM

## 2015-09-23 NOTE — Telephone Encounter (Signed)
ok 

## 2015-09-23 NOTE — Telephone Encounter (Signed)
Patient calling to get rx for her hydrocodone  For 2 months if possible  [phone]

## 2015-09-23 NOTE — Telephone Encounter (Signed)
Ok to refill 

## 2015-09-24 MED ORDER — HYDROCODONE-ACETAMINOPHEN 5-325 MG PO TABS: 1.0000 | ORAL_TABLET | Freq: Four times a day (QID) | ORAL | 0 refills | Status: DC | PRN

## 2015-09-24 NOTE — Telephone Encounter (Signed)
RX printed, left up front and patient aware to pick up  

## 2015-10-21 ENCOUNTER — Other Ambulatory Visit: Payer: Self-pay | Admitting: Family Medicine

## 2015-10-21 NOTE — Telephone Encounter (Signed)
Refill appropriate and filled per protocol. 

## 2015-10-25 ENCOUNTER — Ambulatory Visit: Payer: Medicare Other | Admitting: Family Medicine

## 2015-10-25 ENCOUNTER — Emergency Department (HOSPITAL_BASED_OUTPATIENT_CLINIC_OR_DEPARTMENT_OTHER)
Admission: EM | Admit: 2015-10-25 | Discharge: 2015-10-25 | Disposition: A | Discharge status: Home / Self Care | Payer: Medicare Other | Attending: Emergency Medicine | Admitting: Emergency Medicine

## 2015-10-25 ENCOUNTER — Encounter (HOSPITAL_BASED_OUTPATIENT_CLINIC_OR_DEPARTMENT_OTHER): Payer: Self-pay | Admitting: *Deleted

## 2015-10-25 ENCOUNTER — Emergency Department (HOSPITAL_BASED_OUTPATIENT_CLINIC_OR_DEPARTMENT_OTHER): Payer: Medicare Other

## 2015-10-25 DIAGNOSIS — W541XXA Struck by dog, initial encounter: Secondary | ICD-10-CM | POA: Insufficient documentation

## 2015-10-25 DIAGNOSIS — Y9301 Activity, walking, marching and hiking: Secondary | ICD-10-CM | POA: Diagnosis not present

## 2015-10-25 DIAGNOSIS — S62306A Unspecified fracture of fifth metacarpal bone, right hand, initial encounter for closed fracture: Secondary | ICD-10-CM | POA: Diagnosis not present

## 2015-10-25 DIAGNOSIS — Y929 Unspecified place or not applicable: Secondary | ICD-10-CM | POA: Insufficient documentation

## 2015-10-25 DIAGNOSIS — S6991XA Unspecified injury of right wrist, hand and finger(s), initial encounter: Secondary | ICD-10-CM | POA: Diagnosis present

## 2015-10-25 DIAGNOSIS — Y999 Unspecified external cause status: Secondary | ICD-10-CM | POA: Diagnosis not present

## 2015-10-25 DIAGNOSIS — S6291XA Unspecified fracture of right wrist and hand, initial encounter for closed fracture: Secondary | ICD-10-CM

## 2015-10-25 DIAGNOSIS — Z79899 Other long term (current) drug therapy: Secondary | ICD-10-CM | POA: Insufficient documentation

## 2015-10-25 DIAGNOSIS — S7011XA Contusion of right thigh, initial encounter: Secondary | ICD-10-CM

## 2015-10-25 DIAGNOSIS — W19XXXA Unspecified fall, initial encounter: Secondary | ICD-10-CM

## 2015-10-25 DIAGNOSIS — M25511 Pain in right shoulder: Secondary | ICD-10-CM

## 2015-10-25 DIAGNOSIS — S62396A Other fracture of fifth metacarpal bone, right hand, initial encounter for closed fracture: Secondary | ICD-10-CM | POA: Diagnosis not present

## 2015-10-25 DIAGNOSIS — S62316A Displaced fracture of base of fifth metacarpal bone, right hand, initial encounter for closed fracture: Secondary | ICD-10-CM | POA: Diagnosis not present

## 2015-10-25 DIAGNOSIS — S4991XA Unspecified injury of right shoulder and upper arm, initial encounter: Secondary | ICD-10-CM | POA: Diagnosis not present

## 2015-10-25 NOTE — ED Triage Notes (Signed)
2 weeks ago she fell after being pulled down while walking her dog. Pain in her right hand, right shoulder and right hip.

## 2015-10-25 NOTE — ED Provider Notes (Signed)
[facility] Provider Note   CSN: [id number] Arrival date & time: 10/25/15  1353     History   Chief Complaint Chief Complaint  Patient presents with  . Fall    HPI Rachel Kaufman is a 70 y.o. female.  The history is provided by the patient.  Fall  Episode onset: 2 weeks.  Dog pulled her down while on the leash.   CC: right hand, right shoulder, and right hip pain  Onset/Duration: 2 weeks Timing: constant, worsening Location: noted above Quality: aching, throbbing Severity: moderate Modifying Factors:  Improved by: Norco,   Worsened by: palpation, movement,  Associated Signs/Symptoms:  Pertinent (+): nothing  Pertinent (-): falls since, gait problems, vision changes, headache, chest pain, sob, abd pain, n/v Context:   Past Medical History:  Diagnosis Date  . Depression   . Insomnia   . Postmenopausal HRT (hormone replacement therapy)     Patient Active Problem List   Diagnosis Date Noted  . Insomnia   . Postmenopausal HRT (hormone replacement therapy)     Past Surgical History:  Procedure Laterality Date  . BACK SURGERY      OB History    No data available       Home Medications    Prior to Admission medications   Medication Sig Start Date End Date Taking? Authorizing Provider  buPROPion (WELLBUTRIN XL) 300 MG 24 hr tablet TAKE 1 TABLET BY MOUTH EVERY MORNING 05/18/15  Yes Donita Brooks, MD  cyclobenzaprine (FLEXERIL) 10 MG tablet TAKE 1 TABLET BY MOUTH THREE TIMES DAILY( EVERY EIGHT HOURS) AS NEEDED 04/08/15  Yes Donita Brooks, MD  Escitalopram Oxalate (LEXAPRO PO) Take by mouth.   Yes Historical Provider, MD  fish oil-omega-3 fatty acids 1000 MG capsule Take 2 g by mouth daily.   Yes Historical Provider, MD  HYDROcodone-acetaminophen (NORCO/VICODIN) 5-325 MG tablet Take 1 tablet by mouth every 6 (six) hours as needed for moderate pain. 09/24/15  Yes Donita Brooks, MD  LORazepam (ATIVAN) 1 MG tablet TAKE 1 TABLET BY MOUTH EVERY 8  HOURS AS NEEDED FOR ANXIETY 09/22/15  Yes Donita Brooks, MD  losartan (COZAAR) 50 MG tablet Take 1 tablet (50 mg total) by mouth daily. 07/02/15  Yes Donita Brooks, MD  PREMPRO 0.45-1.5 MG tablet TAKE 1 TABLET BY MOUTH EVERY DAY 12/14/14  Yes Donita Brooks, MD  traZODone (DESYREL) 100 MG tablet TAKE 2 TABLET BY MOUTH EVERY NIGHT AT BEDTIME 08/23/15  Yes Donita Brooks, MD  traZODone (DESYREL) 100 MG tablet TAKE 2 TABLETS BY MOUTH EVERY NIGHT AT BEDTIME 10/21/15  Yes Donita Brooks, MD  UNABLE TO FIND Testosterone 8% mix with cetaphil cream - Apply prn - could not find this medication in our computer - her rx # is [phone] and she can have it filled just once and at last gram as well not sure how much she gets at a time. thanks 10/25/12  Yes Donita Brooks, MD  vitamin B-12 (CYANOCOBALAMIN) 1000 MCG tablet Take 1,000 mcg by mouth daily.   Yes Historical Provider, MD  oxyCODONE-acetaminophen (PERCOCET) 10-325 MG tablet Take 1 tablet by mouth every 4 (four) hours as needed for pain. 06/07/15   Donita Brooks, MD  TRINTELLIX 10 MG TABS TAKE 1 TABLET(10 MG) BY MOUTH DAILY 08/23/15   Donita Brooks, MD  vitamin C (ASCORBIC ACID) 500 MG tablet Take 500 mg by mouth daily.    Historical Provider, MD    [facility]  History No family history on file.  Social History Social History  Substance Use Topics  . Smoking status: Never Smoker  . Smokeless tobacco: Never Used  . Alcohol use Yes     Allergies   Codeine   Review of Systems Review of Systems Ten systems are reviewed and are negative for acute change except as noted in the HPI   Physical Exam Updated Vital Signs BP 144/92   Pulse 61   Temp 98 F (36.7 C) (Oral)   Resp 20   Ht 5\' 3"  (1.6 m)   Wt 106 lb (48.1 kg)   SpO2 99%   BMI 18.78 kg/m   Physical Exam  Constitutional: She is oriented to person, place, and time. She appears well-developed and well-nourished. No distress.  HENT:  Head: Normocephalic and atraumatic.   Right Ear: External ear normal.  Left Ear: External ear normal.  Nose: Nose normal.  Eyes: Conjunctivae and EOM are normal. Pupils are equal, round, and reactive to light. Right eye exhibits no discharge. Left eye exhibits no discharge. No scleral icterus.  Neck: Normal range of motion. Neck supple.  Cardiovascular: Normal rate, regular rhythm and normal heart sounds.  Exam reveals no gallop and no friction rub.   No murmur heard. Pulses:      Radial pulses are 2+ on the right side, and 2+ on the left side.       Dorsalis pedis pulses are 2+ on the right side, and 2+ on the left side.  Pulmonary/Chest: Effort normal and breath sounds normal. No stridor. No respiratory distress. She has no wheezes.  Abdominal: Soft. She exhibits no distension. There is no tenderness.  Musculoskeletal: She exhibits no edema.       Right shoulder: She exhibits tenderness and bony tenderness. She exhibits normal range of motion, no swelling, no effusion, no crepitus, no deformity and no spasm.       Cervical back: She exhibits no bony tenderness.       Thoracic back: She exhibits no bony tenderness.       Lumbar back: She exhibits no bony tenderness.       Arms:      Right upper leg: She exhibits tenderness. She exhibits no bony tenderness.       Legs: Clavicles stable. Chest stable to AP/Lat compression Pelvis stable to Lat compression No obvious extremity deformity  Neurological: She is alert and oriented to person, place, and time.  Moving all extremities  Skin: Skin is warm and dry. No rash noted. She is not diaphoretic. No erythema.  Psychiatric: She has a normal mood and affect.     ED Treatments / Results  Labs (all labs ordered are listed, but only abnormal results are displayed) Labs Reviewed - No data to display  EKG  EKG Interpretation  Date/Time:  Monday October 25 2015 14:13:45 EDT Ventricular Rate:  62 PR Interval:  164 QRS Duration: 82 QT Interval:  436 QTC Calculation: 442 R  Axis:   43 Text Interpretation:  Normal sinus rhythm Normal ECG no prior tracings Confirmed by [facility] MD, Brogan Martis ([id number]) on 10/25/2015 3:42:46 PM       Radiology Dg Shoulder Right  Result Date: 10/25/2015 CLINICAL DATA:  Fall 2 weeks ago.  Right shoulder pain. EXAM: RIGHT SHOULDER - 2+ VIEW COMPARISON:  10/14/2012 FINDINGS: Degenerative changes in the right [facility] and glenohumeral joints with spurring and joint space narrowing. No acute bony abnormality. Specifically, no fracture, subluxation, or dislocation. Soft tissues are intact.  IMPRESSION: No acute bony abnormality. Electronically Signed   By: Charlett NoseKevin  Dover M.D.   On: 10/25/2015 15:44   Dg Hand Complete Right  Result Date: 10/25/2015 CLINICAL DATA:  Fall 2 weeks ago.  Hand pain. EXAM: RIGHT HAND - COMPLETE 3+ VIEW COMPARISON:  None. FINDINGS: There is a fracture through the proximal aspect of the right fifth metacarpal. Mild angulation. No additional acute bony abnormality. No subluxation or dislocation. IMPRESSION: Mildly angulated fracture through the proximal right fifth metacarpal. Electronically Signed   By: Charlett NoseKevin  Dover M.D.   On: 10/25/2015 15:43    Procedures Procedures (including critical care time)  Medications Ordered in ED Medications - No data to display   Initial Impression / Assessment and Plan / ED Course  I have reviewed the triage vital signs and the nursing notes.  Pertinent labs & imaging results that were available during my care of the patient were reviewed by me and considered in my medical decision making (see chart for details).  Clinical Course    Mechanical fall 2 weeks ago with persistent right shoulder right hand and right thigh pain. Plain films of the right shoulder did not reveal any evidence of acute fracture. Plain film of the right hand showed subacute fracture with evidence of healing. On examination of the right thigh patient has likely hematoma. Patient currently not taking any blood thinners. Able  to ambulate without problem.  Patient given instructions for hematoma treatment. Otherwise treat symptomatically for pain. We'll provide patient with hand surgeon number to arrange follow-up.  Final Clinical Impressions(s) / ED Diagnoses   Final diagnoses:  Fall  Right shoulder pain  Right hand fracture, closed, initial encounter  Hematoma of right thigh, initial encounter   Disposition: Discharge  Condition: Good  I have discussed the results, Dx and Tx plan with the patient who expressed understanding and agree(s) with the plan. Discharge instructions discussed at great length. The patient was given strict return precautions who verbalized understanding of the instructions. No further questions at time of discharge.    Current Discharge Medication List      Follow Up: Donita BrooksWarren T Pickard, MD [address] [place] [address] [phone]  Call  As needed  Mack Hookavid Thompson, MD [address] [place] [address] [phone]  Schedule an appointment as soon as possible for a visit  As needed      Nira ConnPedro Eduardo Laticha Ferrucci, MD 10/25/15 1600

## 2015-10-25 NOTE — ED Notes (Signed)
States she has been feeling anxious and having pain in the center of her chest for a week. EKG done at triage. States her husband died a year ago. She was tearful when telling me about her husband.

## 2015-10-25 NOTE — ED Notes (Signed)
MD at bedside. 

## 2015-10-26 ENCOUNTER — Telehealth: Payer: Self-pay | Admitting: *Deleted

## 2015-10-26 NOTE — Telephone Encounter (Signed)
Received call from patient.   Wanted to inform provider that she was seen in ER for hand Fx.   MD to be made aware.

## 2015-10-28 DIAGNOSIS — H2513 Age-related nuclear cataract, bilateral: Secondary | ICD-10-CM | POA: Diagnosis not present

## 2015-10-28 DIAGNOSIS — H04123 Dry eye syndrome of bilateral lacrimal glands: Secondary | ICD-10-CM | POA: Diagnosis not present

## 2015-11-16 ENCOUNTER — Other Ambulatory Visit: Payer: Self-pay | Admitting: Family Medicine

## 2015-11-19 ENCOUNTER — Other Ambulatory Visit: Payer: Self-pay | Admitting: Family Medicine

## 2015-11-24 ENCOUNTER — Telehealth: Payer: Self-pay | Admitting: Family Medicine

## 2015-11-24 DIAGNOSIS — M25511 Pain in right shoulder: Secondary | ICD-10-CM

## 2015-11-24 NOTE — Telephone Encounter (Signed)
Ok to refill 

## 2015-11-24 NOTE — Telephone Encounter (Signed)
CB#[phone]   patient calling requesting 2 month refill on her hydrocodone. [place] and Dr. Tanya NonesPickard normally gives her 2 scripts.

## 2015-11-25 MED ORDER — HYDROCODONE-ACETAMINOPHEN 5-325 MG PO TABS: 1.0000 | ORAL_TABLET | Freq: Four times a day (QID) | ORAL | 0 refills | Status: DC | PRN

## 2015-11-25 NOTE — Telephone Encounter (Signed)
RX printed, left up front and patient aware to pick up after 2 pm via vm 

## 2015-11-25 NOTE — Telephone Encounter (Signed)
ok 

## 2015-12-12 ENCOUNTER — Other Ambulatory Visit: Payer: Self-pay | Admitting: Family Medicine

## 2015-12-14 ENCOUNTER — Other Ambulatory Visit: Payer: Self-pay | Admitting: Family Medicine

## 2015-12-21 ENCOUNTER — Other Ambulatory Visit: Payer: Self-pay | Admitting: Family Medicine

## 2015-12-21 NOTE — Telephone Encounter (Signed)
Ok to refill 

## 2015-12-21 NOTE — Telephone Encounter (Signed)
ok 

## 2016-01-08 ENCOUNTER — Other Ambulatory Visit: Payer: Self-pay | Admitting: Family Medicine

## 2016-01-12 ENCOUNTER — Other Ambulatory Visit: Payer: Self-pay | Admitting: Family Medicine

## 2016-01-24 ENCOUNTER — Telehealth: Payer: Self-pay | Admitting: Family Medicine

## 2016-01-24 NOTE — Telephone Encounter (Signed)
Requesting refill on hydrocodone - Ok to refill??       

## 2016-01-25 MED ORDER — HYDROCODONE-ACETAMINOPHEN 5-325 MG PO TABS: 1.0000 | ORAL_TABLET | Freq: Four times a day (QID) | ORAL | 0 refills | Status: DC | PRN

## 2016-01-25 NOTE — Telephone Encounter (Signed)
RX printed, left up front and patient aware to pick up after 2 pm  

## 2016-01-25 NOTE — Telephone Encounter (Signed)
ok 

## 2016-02-09 ENCOUNTER — Other Ambulatory Visit: Payer: Self-pay | Admitting: Family Medicine

## 2016-02-19 ENCOUNTER — Other Ambulatory Visit: Payer: Self-pay | Admitting: Family Medicine

## 2016-03-13 ENCOUNTER — Other Ambulatory Visit: Payer: Self-pay | Admitting: Family Medicine

## 2016-03-22 ENCOUNTER — Other Ambulatory Visit: Payer: Self-pay | Admitting: Family Medicine

## 2016-03-22 NOTE — Telephone Encounter (Signed)
LRF 12/22/15  #90 + 2.  LOV 07/02/15  OK refill?

## 2016-03-23 ENCOUNTER — Telehealth: Payer: Self-pay | Admitting: Family Medicine

## 2016-03-23 NOTE — Telephone Encounter (Signed)
Ok to refill 

## 2016-03-23 NOTE — Telephone Encounter (Signed)
ok 

## 2016-03-23 NOTE — Telephone Encounter (Signed)
Pt needs hydrocodone refill

## 2016-03-24 MED ORDER — HYDROCODONE-ACETAMINOPHEN 5-325 MG PO TABS: 1.0000 | ORAL_TABLET | Freq: Four times a day (QID) | ORAL | 0 refills | Status: DC | PRN

## 2016-03-24 NOTE — Telephone Encounter (Signed)
Per [facility] ok to refill - RX printed, left up front and patient aware to pick up

## 2016-03-27 ENCOUNTER — Other Ambulatory Visit: Payer: Self-pay | Admitting: Family Medicine

## 2016-03-27 NOTE — Telephone Encounter (Signed)
Rx called in 

## 2016-04-15 ENCOUNTER — Other Ambulatory Visit: Payer: Self-pay | Admitting: Family Medicine

## 2016-05-24 ENCOUNTER — Telehealth: Payer: Self-pay | Admitting: Family Medicine

## 2016-05-24 MED ORDER — HYDROCODONE-ACETAMINOPHEN 5-325 MG PO TABS: 1.0000 | ORAL_TABLET | Freq: Four times a day (QID) | ORAL | 0 refills | Status: DC | PRN

## 2016-05-24 NOTE — Telephone Encounter (Signed)
Patient is requesting a refill on Hydrocodone - Ok to refill??        

## 2016-05-25 NOTE — Telephone Encounter (Signed)
ok 

## 2016-05-25 NOTE — Telephone Encounter (Signed)
RX printed, left up front and patient aware to pick up  

## 2016-06-27 ENCOUNTER — Other Ambulatory Visit: Payer: Self-pay | Admitting: Family Medicine

## 2016-06-27 NOTE — Telephone Encounter (Signed)
Appointment scheduled for 07/25/2016.  Medication called to pharmacy.

## 2016-06-27 NOTE — Telephone Encounter (Signed)
Really needs to be seen.  Last ov 07/02/15.  Ok with temp refill.

## 2016-06-27 NOTE — Telephone Encounter (Signed)
Ok to refill 

## 2016-07-13 ENCOUNTER — Other Ambulatory Visit: Payer: Self-pay | Admitting: Family Medicine

## 2016-07-25 ENCOUNTER — Ambulatory Visit (INDEPENDENT_AMBULATORY_CARE_PROVIDER_SITE_OTHER): Payer: Medicare Other | Admitting: Family Medicine

## 2016-07-25 ENCOUNTER — Encounter: Payer: Self-pay | Admitting: Family Medicine

## 2016-07-25 VITALS — BP 160/92 | HR 60 | Temp 97.9°F | Resp 16 | Ht 63.0 in | Wt 115.0 lb

## 2016-07-25 DIAGNOSIS — M25511 Pain in right shoulder: Secondary | ICD-10-CM

## 2016-07-25 DIAGNOSIS — G8929 Other chronic pain: Secondary | ICD-10-CM | POA: Diagnosis not present

## 2016-07-25 DIAGNOSIS — I10 Essential (primary) hypertension: Secondary | ICD-10-CM | POA: Diagnosis not present

## 2016-07-25 DIAGNOSIS — F321 Major depressive disorder, single episode, moderate: Secondary | ICD-10-CM | POA: Diagnosis not present

## 2016-07-25 LAB — COMPLETE METABOLIC PANEL WITH GFR
ALT: 8 U/L (ref 6–29)
AST: 16 U/L (ref 10–35)
Albumin: 4 g/dL (ref 3.6–5.1)
Alkaline Phosphatase: 46 U/L (ref 33–130)
BUN: 27 mg/dL — ABNORMAL HIGH (ref 7–25)
CO2: 24 mmol/L (ref 20–31)
Calcium: 9.1 mg/dL (ref 8.6–10.4)
Chloride: 106 mmol/L (ref 98–110)
Creat: 0.9 mg/dL (ref 0.50–0.99)
GFR, Est African American: 75 mL/min (ref >=60)
GFR, Est Non African American: 65 mL/min (ref >=60)
Glucose, Bld: 87 mg/dL (ref 70–99)
Potassium: 4.2 mmol/L (ref 3.5–5.3)
Sodium: 140 mmol/L (ref 135–146)
Total Bilirubin: 0.5 mg/dL (ref 0.2–1.2)
Total Protein: 6.4 g/dL (ref 6.1–8.1)

## 2016-07-25 LAB — LIPID PANEL
Cholesterol: 189 mg/dL (ref <200)
HDL: 106 mg/dL (ref >50)
LDL Cholesterol: 73 mg/dL (ref <100)
Total CHOL/HDL Ratio: 1.8 Ratio (ref <5.0)
Triglycerides: 50 mg/dL (ref <150)
VLDL: 10 mg/dL (ref <30)

## 2016-07-25 MED ORDER — DULOXETINE HCL 60 MG PO CPEP: 60.0000 mg | ORAL_CAPSULE | Freq: Every day | ORAL | 3 refills | Status: DC

## 2016-07-25 MED ORDER — HYDROCODONE-ACETAMINOPHEN 5-325 MG PO TABS: 1.0000 | ORAL_TABLET | Freq: Four times a day (QID) | ORAL | 0 refills | Status: DC | PRN

## 2016-07-25 MED ORDER — LORAZEPAM 1 MG PO TABS: 1.0000 mg | ORAL_TABLET | Freq: Three times a day (TID) | ORAL | 0 refills | Status: DC | PRN

## 2016-07-25 NOTE — Progress Notes (Signed)
Subjective:    Patient ID: Rachel Kaufman, female    DOB: 07/02/1945, 71 y.o.   MRN: [id number]  HPI11/2015 Patient is a pleasant 71 year old white female who is requesting today a refill on her trazodone. She takes 200 mg at night to help her sleep. She also takes Ativan 1 mg twice a day as needed for anxiety. These medications are working well for her. Unfortunately she continues to have pain in her neck and in her right shoulder. She is seeing orthopedics was discovered that she has a chronically torn right rotator cuff. Surgery is not an option. She also has cervical radiculopathy stemming from an old fracture in the neck. The fracture has healed but she continues to have neck pain on the right side. At the present time she is able to manage the pain at night Flexeril. She is requesting a refill on the Flexeril. Occasionally she does take hydrocodone. For instance this month she is used 2- 4 pills of hydrocodone over 30 days. She uses the medication very sparingly. She would like a refill on this medication for as needed use. She is overdue for mammogram, Pap smear, bone density, and a colonoscopy. She refuses all these at the present time but she will consider them. Her labs were last checked in May and were relatively normal. She is complaining some of fatigue. She is overdue to check a thyroid test today. She is willing to get her flu shot as well as Pneumovax 23.  At that time, my plan was: I will refill the patient's Flexeril for her shoulder pain in her neck pain. I will also give her a prescription of 30 hydrocodone to be used sparingly for shoulder pain. I did refill her Ativan to be used for anxiety and her trazodone to be used for insomnia. I will check a TSH to evaluate her fatigue. Patient received a flu shot today as well as Pneumovax 23.  05/14/15 Patient is back today complaining of pain in her right shoulder. Pain is worse with abduction greater than 90. She has significant pain with empty  can testing. She has a negative Spurling sign. She has pain with internal and external rotation. She has a positive Hawkins sign. She did have a CT scan of the neck that showed moderate to severe degenerative disc disease at C5-C6 and C6-C7 with possible right-sided nerve impingement at both levels however the strength in her arm is normal, reflexes are normal, and she denies any neuropathic symptoms such as numbness or tingling. Therefore I believe the pain in her shoulder is due to her rotator cuff. The patient states she saw Dr. [place] Blas in the past and she does have a chronic tear in her "rotator cuff". He recommended medical management rather than surgery. However she is been taking hydrocodone without relief. She is here today crying due to the pain. Her blood pressure is significantly elevated however she states that she is severely depressed. Please see my last office visit. At that time I discontinued Lexapro and switch the patient toTrintellix.  However she is seen very little benefit for this. This stems from the loss of her husband. She is still unable to cover from her sadness and mourning.  However she does not want to try any medicine that may cause weight gain.  At that time, my plan was: Patient does not want to treat her blood pressure today. She believes is due to pain as well as anxiety and sadness. Therefore I will treat the  pain is best I can. Discontinue hydrocodone and switch to Percocet 10/325 one by mouth every 6 hours when necessary pain. I gave her 1860. This is temporary. Hopefully the cortisone injection I performed today and her shoulder will help ease the pain somewhat so she will not need this and we can resume her previous dose of hydrocodone. Using sterile technique, I injected the right shoulder with 2 mL of lidocaine, 2 mL of Marcaine, and 2 mL of 40 mg per mL Kenalog. The patient tolerated the procedure well without complication. I asked her to call the back next week. I do not  want to change too many medications at one time but if she is tolerating the new pain medication, I will switch the patient back to Lexapro and discontinue Trintellix.  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 She continues to have pain in her shoulder. I refused to continue to refill the Percocet. I recommended that she resume her previous dose of hydrocodone twice daily to avoid increasing dependence to stronger and stronger narcotics. However she states that her depression is doing much better on Trintellix and she now has no desire to switch.  At that time, my plan was: Continue Trintellix 10 mg poqday.  Add losartan 50 mg by mouth daily and recheck blood pressure in one month. Continue hydrocodone/acetaminophen 5/325 one by mouth twice a day 60 tablets per month for right shoulder pain. The pain worsens I will recommend a repeat for consultation versus a pain clinic  07/25/16 She is here today because I would not refill her medications until she was seen.  She continues to report severe depression.  She quit Trintellix and resumed lexapro.  However, she is only using lexapro 3 times a week because she wants to get off of it.  She uses ativan twice a day, sometimes three times a day.  The thoughts of her deceased husband will come to her leaving her in an uncontrolled crying spell without the medication.  She is also concerned about her sister who was diagnosed with colon cancer.  She uses hydrocodone once a day for chronic shoulder pain.  Will occasionally use 2 pills a day.  Has seen psychiatry in the past and tried and failed effexor as well.  She reports daily depression, anhedonia, insomnia, and poor energy.  She denies suicidal thoughts.   Past Medical History:  Diagnosis Date  . Depression   . Insomnia   . Postmenopausal HRT (hormone  replacement therapy)    Past Surgical History:  Procedure Laterality Date  . BACK SURGERY     Current Outpatient Prescriptions on File Prior to Visit  Medication Sig Dispense Refill  . cyclobenzaprine (FLEXERIL) 10 MG tablet TAKE 1 TABLET BY MOUTH THREE TIMES DAILY( EVERY EIGHT HOURS) AS NEEDED 30 tablet 0  . escitalopram (LEXAPRO) 20 MG tablet TAKE 1 TABLET BY MOUTH DAILY 30 tablet 0  . Escitalopram Oxalate (LEXAPRO PO) Take by mouth.    . fish oil-omega-3 fatty acids 1000 MG capsule Take 2 g by mouth daily.    Marland Kitchen. PREMPRO 0.45-1.5 MG tablet TAKE 1 TABLET BY MOUTH EVERY DAY 28 tablet 11  . traZODone (DESYREL) 100 MG tablet TAKE 2 TABLETS BY MOUTH EVERY NIGHT AT BEDTIME 60 tablet 0  . UNABLE TO FIND Testosterone 8% mix with cetaphil cream - Apply  prn - could not find this medication in our computer - her rx # is [phone] and she can have it filled just once and at last gram as well not sure how much she gets at a time. thanks 30 g 0  . vitamin B-12 (CYANOCOBALAMIN) 1000 MCG tablet Take 1,000 mcg by mouth daily.    . vitamin C (ASCORBIC ACID) 500 MG tablet Take 500 mg by mouth daily.    Marland Kitchen buPROPion (WELLBUTRIN XL) 300 MG 24 hr tablet TAKE 1 TABLET BY MOUTH EVERY MORNING 90 tablet 3  . losartan (COZAAR) 50 MG tablet Take 1 tablet (50 mg total) by mouth daily. (Patient not taking: Reported on 07/25/2016) 30 tablet 5  . TRINTELLIX 10 MG TABS TAKE 1 TABLET(10 MG) BY MOUTH DAILY (Patient not taking: Reported on 07/25/2016) 30 tablet 5   No current facility-administered medications on file prior to visit.    Allergies  Allergen Reactions  . Codeine Nausea And Vomiting   Social History   Social History  . Marital status: Single    Spouse name: N/A  . Number of children: N/A  . Years of education: N/A   Occupational History  . Not on file.   Social History Main Topics  . Smoking status: Never Smoker  . Smokeless tobacco: Never Used  . Alcohol use Yes  . Drug use: Yes    Types: Marijuana   . Sexual activity: Yes    Birth control/ protection: None   Other Topics Concern  . Not on file   Social History Narrative  . No narrative on file      Review of Systems  All other systems reviewed and are negative.      Objective:   Physical Exam  Neck: No thyromegaly present.  Cardiovascular: Normal rate, regular rhythm and normal heart sounds.   Pulmonary/Chest: Effort normal and breath sounds normal. No respiratory distress. She has no wheezes. She has no rales.  Abdominal: Soft. Bowel sounds are normal. She exhibits no distension. There is no tenderness. There is no rebound and no guarding.  Musculoskeletal: She exhibits no edema.       Right shoulder: She exhibits decreased range of motion, tenderness, pain and decreased strength. She exhibits no crepitus.  Skin: No rash noted.  Vitals reviewed.         Assessment & Plan:  Benign essential HTN - Plan: COMPLETE METABOLIC PANEL WITH GFR, Lipid panel  Current moderate episode of major depressive disorder without prior episode (HCC)  Chronic right shoulder pain  Her blood pressure is high and she stopped losartan.  She is adamant her blood pressure is great at home 110-130/60-70.  I am concerned by her continued dependence on frequent controlled substances.  I do not feel she is trying to abuse them.  However, I am worried this will eventually cause her problems such as falls or memory loss.  Therefore, I will wean her down on the medication.    Decrease hydrocodone from 120/27months to 90/71months.  Decrease ativan from 90 to 60 per month.  Only take ativan at night.  Try to use only half a pill in the daytime for "crying spells."  Focus on trying to prevent more effectively.  Discontinue lexapro and try cymbalta 60 mg poqday instead.  Reassess in 6 weeks.

## 2016-08-12 ENCOUNTER — Other Ambulatory Visit: Payer: Self-pay | Admitting: Family Medicine

## 2016-09-28 ENCOUNTER — Telehealth: Payer: Self-pay | Admitting: Family Medicine

## 2016-09-28 MED ORDER — HYDROCODONE-ACETAMINOPHEN 5-325 MG PO TABS: 1.0000 | ORAL_TABLET | Freq: Four times a day (QID) | ORAL | 0 refills | Status: DC | PRN

## 2016-09-28 NOTE — Telephone Encounter (Signed)
Patient is requesting a refill on Hydrocodone - Ok to refill??       LRF - 07/25/16  CB# [phone]

## 2016-09-28 NOTE — Telephone Encounter (Signed)
RX printed, left up front and patient aware to pick up  

## 2016-09-28 NOTE — Telephone Encounter (Signed)
ok 

## 2016-11-02 ENCOUNTER — Other Ambulatory Visit: Payer: Self-pay | Admitting: Family Medicine

## 2016-11-02 NOTE — Telephone Encounter (Signed)
Ok to refill 

## 2016-11-03 ENCOUNTER — Other Ambulatory Visit: Payer: Self-pay | Admitting: *Deleted

## 2016-11-03 NOTE — Telephone Encounter (Signed)
Received fax requesting refill on Ativan.   Refill request noted to be duplicate.

## 2016-11-03 NOTE — Telephone Encounter (Signed)
ok 

## 2016-11-06 ENCOUNTER — Other Ambulatory Visit: Payer: Self-pay | Admitting: Family Medicine

## 2016-11-18 ENCOUNTER — Other Ambulatory Visit: Payer: Self-pay | Admitting: Family Medicine

## 2016-11-23 ENCOUNTER — Telehealth: Payer: Self-pay | Admitting: Family Medicine

## 2016-11-23 MED ORDER — HYDROCODONE-ACETAMINOPHEN 5-325 MG PO TABS: 1.0000 | ORAL_TABLET | Freq: Four times a day (QID) | ORAL | 0 refills | Status: DC | PRN

## 2016-11-23 NOTE — Telephone Encounter (Signed)
ok 

## 2016-11-23 NOTE — Telephone Encounter (Signed)
Patient is requesting a refill on Hydrocodone - Ok to refill??        

## 2016-11-23 NOTE — Telephone Encounter (Signed)
RX printed, left up front and patient aware to pick up  

## 2016-12-04 ENCOUNTER — Other Ambulatory Visit: Payer: Self-pay | Admitting: Family Medicine

## 2016-12-04 NOTE — Telephone Encounter (Signed)
rx called into pharmacy

## 2016-12-04 NOTE — Telephone Encounter (Signed)
See last office visit  OK refill?

## 2016-12-04 NOTE — Telephone Encounter (Signed)
ok 

## 2016-12-07 ENCOUNTER — Other Ambulatory Visit: Payer: Self-pay

## 2016-12-07 NOTE — Telephone Encounter (Signed)
Last OV 5/29 Last refill 11/03/2016 Ok to refill?

## 2016-12-07 NOTE — Telephone Encounter (Signed)
ok 

## 2016-12-11 MED ORDER — LORAZEPAM 1 MG PO TABS: ORAL_TABLET | ORAL | 0 refills | Status: DC

## 2016-12-11 NOTE — Telephone Encounter (Signed)
rx called into pharmacy

## 2016-12-27 DIAGNOSIS — H04123 Dry eye syndrome of bilateral lacrimal glands: Secondary | ICD-10-CM | POA: Diagnosis not present

## 2016-12-27 DIAGNOSIS — H43391 Other vitreous opacities, right eye: Secondary | ICD-10-CM | POA: Diagnosis not present

## 2016-12-27 DIAGNOSIS — H2513 Age-related nuclear cataract, bilateral: Secondary | ICD-10-CM | POA: Diagnosis not present

## 2016-12-28 ENCOUNTER — Emergency Department (HOSPITAL_BASED_OUTPATIENT_CLINIC_OR_DEPARTMENT_OTHER): Payer: Medicare Other

## 2016-12-28 ENCOUNTER — Encounter (HOSPITAL_BASED_OUTPATIENT_CLINIC_OR_DEPARTMENT_OTHER): Payer: Self-pay | Admitting: *Deleted

## 2016-12-28 ENCOUNTER — Emergency Department (HOSPITAL_BASED_OUTPATIENT_CLINIC_OR_DEPARTMENT_OTHER)
Admission: EM | Admit: 2016-12-28 | Discharge: 2016-12-28 | Disposition: A | Discharge status: Home / Self Care | Payer: Medicare Other | Attending: Emergency Medicine | Admitting: Emergency Medicine

## 2016-12-28 DIAGNOSIS — X501XXA Overexertion from prolonged static or awkward postures, initial encounter: Secondary | ICD-10-CM | POA: Diagnosis not present

## 2016-12-28 DIAGNOSIS — S62641A Nondisplaced fracture of proximal phalanx of left index finger, initial encounter for closed fracture: Secondary | ICD-10-CM | POA: Insufficient documentation

## 2016-12-28 DIAGNOSIS — S6992XA Unspecified injury of left wrist, hand and finger(s), initial encounter: Secondary | ICD-10-CM | POA: Diagnosis present

## 2016-12-28 DIAGNOSIS — S62661A Nondisplaced fracture of distal phalanx of left index finger, initial encounter for closed fracture: Secondary | ICD-10-CM

## 2016-12-28 DIAGNOSIS — S61211A Laceration without foreign body of left index finger without damage to nail, initial encounter: Secondary | ICD-10-CM | POA: Insufficient documentation

## 2016-12-28 DIAGNOSIS — R52 Pain, unspecified: Secondary | ICD-10-CM

## 2016-12-28 DIAGNOSIS — Z79899 Other long term (current) drug therapy: Secondary | ICD-10-CM | POA: Insufficient documentation

## 2016-12-28 DIAGNOSIS — Y929 Unspecified place or not applicable: Secondary | ICD-10-CM | POA: Insufficient documentation

## 2016-12-28 DIAGNOSIS — Y9301 Activity, walking, marching and hiking: Secondary | ICD-10-CM | POA: Diagnosis not present

## 2016-12-28 DIAGNOSIS — Y999 Unspecified external cause status: Secondary | ICD-10-CM | POA: Insufficient documentation

## 2016-12-28 DIAGNOSIS — S62643A Nondisplaced fracture of proximal phalanx of left middle finger, initial encounter for closed fracture: Secondary | ICD-10-CM | POA: Diagnosis not present

## 2016-12-28 MED ORDER — LIDOCAINE HCL 2 % IJ SOLN
10.0000 mL | Freq: Once | INTRAMUSCULAR | Status: AC
  Administered 2016-12-28: 200 mg
  Filled 2016-12-28: qty 20

## 2016-12-28 MED ORDER — CEPHALEXIN 250 MG PO CAPS: 250.0000 mg | ORAL_CAPSULE | Freq: Four times a day (QID) | ORAL | 0 refills | Status: DC

## 2016-12-28 MED FILL — CEPHALEXIN 250 MG CAPSULE: 250 | 7 days supply | Qty: 28 | Fill #0

## 2016-12-28 NOTE — ED Provider Notes (Signed)
[facility] EMERGENCY DEPARTMENT Provider Note   CSN: [id number] Arrival date & time: 12/28/16  1118     History   Chief Complaint Chief Complaint  Patient presents with  . Laceration    HPI Rachel Kaufman is a 71 y.o. female presenting with finger injury.  Patient states that yesterday afternoon, she was leaving her house to walk the dog.  Her hand was in the doorknob, when her dog suddenly pulled, and her finger was caught in the door knob.  She had acute onset left index finger pain and laceration.  She washed the laceration with soap and water, and bandaged it up.  She is coming in today, she reports continued throbbing.  She has a laceration at the proximal part of her posterior index finger.  Pain is at the site of the laceration and distally.  She denies numbness or tingling of the hand.  She has full active range of motion of the finger with minimal pain.  She denies injury or pain elsewhere.  She has no other medical problems, is not immunocompromised.  She does not take blood thinners.  Last tetanus shot was in 2013.  HPI  Past Medical History:  Diagnosis Date  . Depression   . Insomnia   . Postmenopausal HRT (hormone replacement therapy)     Patient Active Problem List   Diagnosis Date Noted  . Insomnia   . Postmenopausal HRT (hormone replacement therapy)     Past Surgical History:  Procedure Laterality Date  . BACK SURGERY      OB History    No data available       Home Medications    Prior to Admission medications   Medication Sig Start Date End Date Taking? Authorizing Provider  buPROPion (WELLBUTRIN XL) 300 MG 24 hr tablet TAKE 1 TABLET BY MOUTH EVERY MORNING 02/23/16   Donita Brooks, MD  cephALEXin (KEFLEX) 250 MG capsule Take 1 capsule (250 mg total) by mouth 4 (four) times daily. 12/28/16   Bryston Colocho, PA-C  cyclobenzaprine (FLEXERIL) 10 MG tablet TAKE 1 TABLET BY MOUTH THREE TIMES DAILY( EVERY EIGHT HOURS) AS NEEDED 04/08/15    Donita Brooks, MD  DULoxetine (CYMBALTA) 60 MG capsule Take 1 capsule (60 mg total) by mouth daily. 11/20/16   Donita Brooks, MD  escitalopram (LEXAPRO) 20 MG tablet TAKE 1 TABLET BY MOUTH DAILY 07/13/16   Donita Brooks, MD  Escitalopram Oxalate (LEXAPRO PO) Take by mouth.    [provider]  fish oil-omega-3 fatty acids 1000 MG capsule Take 2 g by mouth daily.    [provider]  HYDROcodone-acetaminophen (NORCO/VICODIN) 5-325 MG tablet Take 1 tablet by mouth every 6 (six) hours as needed for moderate pain (DX: G89.4). 11/23/16   Donita Brooks, MD  LORazepam (ATIVAN) 1 MG tablet TAKE 1 TABLET BY MOUTH EVERY 8 HOURS AS NEEDED. TRY TO TAKE ONLY AT NIGHT AND USE LESS DURING THE DAY 12/11/16   Donita Brooks, MD  losartan (COZAAR) 50 MG tablet Take 1 tablet (50 mg total) by mouth daily. Patient not taking: Reported on 07/25/2016 07/02/15   Donita Brooks, MD  PREMPRO 0.45-1.5 MG tablet TAKE 1 TABLET BY MOUTH EVERY DAY 12/13/15   Donita Brooks, MD  traZODone (DESYREL) 100 MG tablet TAKE 2 TABLETS BY MOUTH EVERY NIGHT AT BEDTIME 12/04/16   Donita Brooks, MD  TRINTELLIX 10 MG TABS TAKE 1 TABLET(10 MG) BY MOUTH DAILY Patient not taking: Reported  on 07/25/2016 08/23/15   Donita BrooksPickard, Warren T, MD  UNABLE TO FIND Testosterone 8% mix with cetaphil cream - Apply prn - could not find this medication in our computer - her rx # is [id number] and she can have it filled just once and at last gram as well not sure how much she gets at a time. thanks 10/25/12   Donita BrooksPickard, Warren T, MD  vitamin B-12 (CYANOCOBALAMIN) 1000 MCG tablet Take 1,000 mcg by mouth daily.    [provider]  vitamin C (ASCORBIC ACID) 500 MG tablet Take 500 mg by mouth daily.    [provider]    Family History No family history on file.  Social History Social History  Substance Use Topics  . Smoking status: Never Smoker  . Smokeless tobacco: Never Used  . Alcohol use Yes      Allergies   Codeine   Review of Systems Review of Systems  Musculoskeletal: Positive for arthralgias.  Skin: Positive for wound.  Neurological: Negative for numbness.  Hematological: Does not bruise/bleed easily.    Physical Exam Updated Vital Signs BP 140/75   Pulse 61   Temp 98.3 F (36.8 C) (Oral)   Resp 20   Ht 5' 2.5" (1.588 m)   Wt 44.5 kg (98 lb)   SpO2 98%   BMI 17.64 kg/m   Physical Exam  Constitutional: She is oriented to person, place, and time. She appears well-developed and well-nourished. No distress.  HENT:  Head: Normocephalic and atraumatic.  Eyes: EOM are normal.  Neck: Normal range of motion.  Pulmonary/Chest: Effort normal.  Abdominal: She exhibits no distension.  Musculoskeletal: Normal range of motion. She exhibits tenderness.  Full active range of motion of the index finger.  Sensation intact.  Strength against resistance intact.  Radial pulses intact bilaterally.  No obvious injury noted elsewhere.  Neurological: She is alert and oriented to person, place, and time.  Skin: Skin is warm. No rash noted.  2.5 cm laceration on posterior left index finger between the MCP and PIP.  No active bleeding.  Sensation intact distally.  Mild swelling at MCP and throughout index finger.  Psychiatric: She has a normal mood and affect.  Nursing note and vitals reviewed.    ED Treatments / Results  Labs (all labs ordered are listed, but only abnormal results are displayed) Labs Reviewed - No data to display  EKG  EKG Interpretation None       Radiology Dg Finger Index Left  Result Date: 12/28/2016 CLINICAL DATA:  Injury walking dog.  Laceration.  Pain. EXAM: LEFT INDEX FINGER 2+V COMPARISON:  No recent prior . FINDINGS: Nondisplaced fracture of the base of the distal phalanx of the left second digit appears to be present. Diffuse degenerative change. Soft tissue swelling. No radiopaque foreign body . IMPRESSION: Nondisplaced fracture of the  base of the proximal phalanx of the left second digit. Electronically Signed   By: Maisie Fushomas  Register   On: 12/28/2016 12:18    Procedures Procedures (including critical care time)  Medications Ordered in ED Medications  lidocaine (XYLOCAINE) 2 % (with pres) injection 200 mg (200 mg Infiltration Given by Other 12/28/16 1211)     Initial Impression / Assessment and Plan / ED Course  I have reviewed the triage vital signs and the nursing notes.  Pertinent labs & imaging results that were available during my care of the patient were reviewed by me and considered in my medical decision making (see chart for details).  Patient presenting with injury to the left index finger occurring yesterday.  Laceration is almost 24 hours old.  Physical exam shows patient is neurovascularly intact.  Will obtain x-ray for further evaluation.  Discussed with attending, Dr. Jacqulyn Bath evaluated the patient.  X-ray shows nondisplaced fracture of the distal finger.  This is not where the laceration is, however it is on the same finger.  Digit was numbed and irrigated with sterile saline and Betadine.  Will apply sterile dressing and consult with hand to ensure it is safe for patient to follow-up with PCP.  Case discussed with Dr. Janee Morn, who agrees that patient is safe to follow-up with PCP.  Will start patient on Keflex.  Wound care instructions given.  Patient to follow-up with PCP Monday of next week (4 days) or sooner as needed.  At this time, patient appears safe for discharge.  Return precautions given.  Patient states she understands and agrees to plan.   Final Clinical Impressions(s) / ED Diagnoses   Final diagnoses:  Laceration of left index finger without foreign body without damage to nail, initial encounter  Closed nondisplaced fracture of distal phalanx of left index finger, initial encounter    New Prescriptions Discharge Medication List as of 12/28/2016  1:32 PM    START taking these  medications   Details  cephALEXin (KEFLEX) 250 MG capsule Take 1 capsule (250 mg total) by mouth 4 (four) times daily., Starting Thu 12/28/2016, Print         [place], [place], PA-C 12/28/16 1724    Maia Plan, MD 12/29/16 [phone]

## 2016-12-28 NOTE — ED Notes (Signed)
ED Provider at bedside. [place], EDPA at bedside

## 2016-12-28 NOTE — ED Notes (Signed)
Patient transported to X-ray 

## 2016-12-28 NOTE — ED Triage Notes (Signed)
Laceration to her left 2nd digit yesterday. Her hand was in the door handle when he dog pulled her suddenly. Dressing inplace that she had applied after the laceration. Dressing removed to show a 1' deep laceration to the lateral side of her finger.

## 2016-12-28 NOTE — Discharge Instructions (Signed)
Take antibiotics as prescribed.  Make sure you complete the entire course of antibiotics. Use Tylenol or ibuprofen as needed for pain. Keep the dressing on for the next 24 hours.  After this, you may remove dressing, wash gently with soap and water, and reapply dressing. Follow-up with primary care in 3-4 days for wound check to make sure that it is healing appropriately. Return to the emergency room if you develop fevers, chills, vomiting, red streaking up your hand, new numbness, or any new or worsening symptoms.

## 2017-01-04 ENCOUNTER — Other Ambulatory Visit: Payer: Self-pay | Admitting: Family Medicine

## 2017-01-04 MED ORDER — TRAZODONE HCL 100 MG PO TABS: 200.0000 mg | ORAL_TABLET | Freq: Every day | ORAL | 2 refills | Status: DC

## 2017-01-08 ENCOUNTER — Other Ambulatory Visit: Payer: Self-pay | Admitting: Family Medicine

## 2017-01-08 MED ORDER — TRAZODONE HCL 100 MG PO TABS: 200.0000 mg | ORAL_TABLET | Freq: Every day | ORAL | 2 refills | Status: DC

## 2017-01-11 ENCOUNTER — Other Ambulatory Visit: Payer: Self-pay | Admitting: Family Medicine

## 2017-01-12 ENCOUNTER — Telehealth: Payer: Self-pay | Admitting: Family Medicine

## 2017-01-12 MED ORDER — LORAZEPAM 1 MG PO TABS: ORAL_TABLET | ORAL | 2 refills | Status: DC

## 2017-01-12 MED ORDER — CONJ ESTROG-MEDROXYPROGEST ACE 0.45-1.5 MG PO TABS: 1.0000 | ORAL_TABLET | Freq: Every day | ORAL | 11 refills | Status: DC

## 2017-01-12 NOTE — Telephone Encounter (Signed)
Requesting refill on lorazepam and prempro -- per Dr. Tanya NonesPickard ok to refill with refills.  Medication called/sent to requested pharmacy and pt aware

## 2017-01-25 ENCOUNTER — Telehealth: Payer: Self-pay | Admitting: Family Medicine

## 2017-01-25 MED ORDER — HYDROCODONE-ACETAMINOPHEN 5-325 MG PO TABS: 1.0000 | ORAL_TABLET | Freq: Four times a day (QID) | ORAL | 0 refills | Status: DC | PRN

## 2017-01-25 NOTE — Telephone Encounter (Signed)
I reviewed her last office note.  On 07/25/16 Dr. Demetrius CharityP was concerned about patient developing dependence on medicines.  He decreased quantity of hydrocodone down to #90 to last for 2 months.  Said to follow-up in 6 weeks.  She has had no follow-up office visit with him since.  She currently has no appointment scheduled.  Tell her to schedule office visit with Dr. Tanya NonesPickard.  Will give her 1 month supply of medication to hold her over until office visit.  Print Rx for #45+0.

## 2017-01-25 NOTE — Telephone Encounter (Signed)
Patient is requesting a refill on Hydrocodone - Ok to refill??       LRF - 11/23/16

## 2017-01-25 NOTE — Telephone Encounter (Signed)
RX printed, left up front and patient aware to pick up and will make apt when she picks up rx

## 2017-02-06 ENCOUNTER — Other Ambulatory Visit: Payer: Self-pay | Admitting: Family Medicine

## 2017-02-07 NOTE — Telephone Encounter (Signed)
Medication refilled per protocol. 

## 2017-02-22 ENCOUNTER — Encounter: Payer: Self-pay | Admitting: Family Medicine

## 2017-02-22 ENCOUNTER — Ambulatory Visit (INDEPENDENT_AMBULATORY_CARE_PROVIDER_SITE_OTHER): Payer: Medicare Other | Admitting: Family Medicine

## 2017-02-22 VITALS — BP 150/80 | HR 86 | Temp 98.1°F | Resp 14 | Ht 63.0 in | Wt 102.0 lb

## 2017-02-22 DIAGNOSIS — G8929 Other chronic pain: Secondary | ICD-10-CM

## 2017-02-22 DIAGNOSIS — R030 Elevated blood-pressure reading, without diagnosis of hypertension: Secondary | ICD-10-CM

## 2017-02-22 DIAGNOSIS — Z23 Encounter for immunization: Secondary | ICD-10-CM

## 2017-02-22 DIAGNOSIS — M25511 Pain in right shoulder: Secondary | ICD-10-CM

## 2017-02-22 DIAGNOSIS — F321 Major depressive disorder, single episode, moderate: Secondary | ICD-10-CM | POA: Diagnosis not present

## 2017-02-22 DIAGNOSIS — G4700 Insomnia, unspecified: Secondary | ICD-10-CM

## 2017-02-22 LAB — CBC WITH DIFFERENTIAL/PLATELET
Basophils Absolute: 20 cells/uL (ref 0–200)
Basophils Relative: 0.4 %
Eosinophils Absolute: 40 cells/uL (ref 15–500)
Eosinophils Relative: 0.8 %
HCT: 35.1 % (ref 35.0–45.0)
Hemoglobin: 11.8 g/dL (ref 11.7–15.5)
Lymphs Abs: 1090 cells/uL (ref 850–3900)
MCH: 32.8 pg (ref 27.0–33.0)
MCHC: 33.6 g/dL (ref 32.0–36.0)
MCV: 97.5 fL (ref 80.0–100.0)
MPV: 11.9 fL (ref 7.5–12.5)
Monocytes Relative: 8 %
Neutro Abs: 3450 cells/uL (ref 1500–7800)
Neutrophils Relative %: 69 %
Platelets: 176 10*3/uL (ref 140–400)
RBC: 3.6 10*6/uL — ABNORMAL LOW (ref 3.80–5.10)
RDW: 12 % (ref 11.0–15.0)
Total Lymphocyte: 21.8 %
WBC mixed population: 400 cells/uL (ref 200–950)
WBC: 5 10*3/uL (ref 3.8–10.8)

## 2017-02-22 LAB — COMPLETE METABOLIC PANEL WITH GFR
AG Ratio: 2.2 (calc) (ref 1.0–2.5)
ALT: 8 U/L (ref 6–29)
AST: 15 U/L (ref 10–35)
Albumin: 4 g/dL (ref 3.6–5.1)
Alkaline phosphatase (APISO): 52 U/L (ref 33–130)
BUN: 19 mg/dL (ref 7–25)
CO2: 25 mmol/L (ref 20–32)
Calcium: 9 mg/dL (ref 8.6–10.4)
Chloride: 109 mmol/L (ref 98–110)
Creat: 0.76 mg/dL (ref 0.60–0.93)
GFR, Est African American: 92 mL/min/{1.73_m2} (ref >=60)
GFR, Est Non African American: 79 mL/min/{1.73_m2} (ref >=60)
Globulin: 1.8 g/dL (calc) — ABNORMAL LOW (ref 1.9–3.7)
Glucose, Bld: 84 mg/dL (ref 65–99)
Potassium: 4.5 mmol/L (ref 3.5–5.3)
Sodium: 140 mmol/L (ref 135–146)
Total Bilirubin: 0.5 mg/dL (ref 0.2–1.2)
Total Protein: 5.8 g/dL — ABNORMAL LOW (ref 6.1–8.1)

## 2017-02-22 LAB — LIPID PANEL
Cholesterol: 170 mg/dL (ref <200)
HDL: 80 mg/dL (ref >50)
LDL Cholesterol (Calc): 76 mg/dL (calc)
Non-HDL Cholesterol (Calc): 90 mg/dL (calc) (ref <130)
Total CHOL/HDL Ratio: 2.1 (calc) (ref <5.0)
Triglycerides: 63 mg/dL (ref <150)

## 2017-02-22 MED ORDER — HYDROCODONE-ACETAMINOPHEN 5-325 MG PO TABS: 1.0000 | ORAL_TABLET | Freq: Four times a day (QID) | ORAL | 0 refills | Status: DC | PRN

## 2017-02-22 MED ORDER — LORAZEPAM 1 MG PO TABS: ORAL_TABLET | ORAL | 2 refills | Status: DC

## 2017-02-22 NOTE — Progress Notes (Signed)
Subjective:    Patient ID: Rachel Kaufman, female    DOB: 10-21-1945, 71 y.o.   MRN: [id number]  Medication Refill   12/2013 Patient is a pleasant 71 year old white female who is requesting today a refill on her trazodone. She takes 200 mg at night to help her sleep. She also takes Ativan 1 mg twice a day as needed for anxiety. These medications are working well for her. Unfortunately she continues to have pain in her neck and in her right shoulder. She is seeing orthopedics was discovered that she has a chronically torn right rotator cuff. Surgery is not an option. She also has cervical radiculopathy stemming from an old fracture in the neck. The fracture has healed but she continues to have neck pain on the right side. At the present time she is able to manage the pain at night Flexeril. She is requesting a refill on the Flexeril. Occasionally she does take hydrocodone. For instance this month she is used 2- 4 pills of hydrocodone over 30 days. She uses the medication very sparingly. She would like a refill on this medication for as needed use. She is overdue for mammogram, Pap smear, bone density, and a colonoscopy. She refuses all these at the present time but she will consider them. Her labs were last checked in May and were relatively normal. She is complaining some of fatigue. She is overdue to check a thyroid test today. She is willing to get her flu shot as well as Pneumovax 23.  At that time, my plan was: I will refill the patient's Flexeril for her shoulder pain in her neck pain. I will also give her a prescription of 30 hydrocodone to be used sparingly for shoulder pain. I did refill her Ativan to be used for anxiety and her trazodone to be used for insomnia. I will check a TSH to evaluate her fatigue. Patient received a flu shot today as well as Pneumovax 23.  05/14/15 Patient is back today complaining of pain in her right shoulder. Pain is worse with abduction greater than 90. She has  significant pain with empty can testing. She has a negative Spurling sign. She has pain with internal and external rotation. She has a positive Hawkins sign. She did have a CT scan of the neck that showed moderate to severe degenerative disc disease at C5-C6 and C6-C7 with possible right-sided nerve impingement at both levels however the strength in her arm is normal, reflexes are normal, and she denies any neuropathic symptoms such as numbness or tingling. Therefore I believe the pain in her shoulder is due to her rotator cuff. The patient states she saw Dr. Fox Chase Blas in the past and she does have a chronic tear in her "rotator cuff". He recommended medical management rather than surgery. However she is been taking hydrocodone without relief. She is here today crying due to the pain. Her blood pressure is significantly elevated however she states that she is severely depressed. Please see my last office visit. At that time I discontinued Lexapro and switch the patient toTrintellix.  However she is seen very little benefit for this. This stems from the loss of her husband. She is still unable to cover from her sadness and mourning.  However she does not want to try any medicine that may cause weight gain.  At that time, my plan was: Patient does not want to treat her blood pressure today. She believes is due to pain as well as anxiety and sadness. Therefore  I will treat the pain is best I can. Discontinue hydrocodone and switch to Percocet 10/325 one by mouth every 6 hours when necessary pain. I gave her 3960. This is temporary. Hopefully the cortisone injection I performed today and her shoulder will help ease the pain somewhat so she will not need this and we can resume her previous dose of hydrocodone. Using sterile technique, I injected the right shoulder with 2 mL of lidocaine, 2 mL of Marcaine, and 2 mL of 40 mg per mL Kenalog. The patient tolerated the procedure well without complication. I asked her to call the  back next week. I do not want to change too many medications at one time but if she is tolerating the new pain medication, I will switch the patient back to Lexapro and discontinue Trintellix.  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 She continues to have pain in her shoulder. I refused to continue to refill the Percocet. I recommended that she resume her previous dose of hydrocodone twice daily to avoid increasing dependence to stronger and stronger narcotics. However she states that her depression is doing much better on Trintellix and she now has no desire to switch.  At that time, my plan was: Continue Trintellix 10 mg poqday.  Add losartan 50 mg by mouth daily and recheck blood pressure in one month. Continue hydrocodone/acetaminophen 5/325 one by mouth twice a day 60 tablets per month for right shoulder pain. The pain worsens I will recommend a repeat for consultation versus a pain clinic  07/25/16 She is here today because I would not refill her medications until she was seen.  She continues to report severe depression.  She quit Trintellix and resumed lexapro.  However, she is only using lexapro 3 times a week because she wants to get off of it.  She uses ativan twice a day, sometimes three times a day.  The thoughts of her deceased husband will come to her leaving her in an uncontrolled crying spell without the medication.  She is also concerned about her sister who was diagnosed with colon cancer.  She uses hydrocodone once a day for chronic shoulder pain.  Will occasionally use 2 pills a day.  Has seen psychiatry in the past and tried and failed effexor as well.  She reports daily depression, anhedonia, insomnia, and poor energy.  She denies suicidal thoughts.  At that time, my plan was: Her blood pressure is high and she stopped  losartan.  She is adamant her blood pressure is great at home 110-130/60-70.  I am concerned by her continued dependence on frequent controlled substances.  I do not feel she is trying to abuse them.  However, I am worried this will eventually cause her problems such as falls or memory loss.  Therefore, I will wean her down on the medication.    Decrease hydrocodone from 120/182months to 90/232months.  Decrease ativan from 90 to 60 per month.  Only take ativan at night.  Try to use only half a pill in the daytime for "crying spells."  Focus on trying to prevent more effectively.  Discontinue lexapro and try cymbalta 60 mg poqday instead.  Reassess in 6 weeks.    02/22/17 Patient is here today at my request for follow-up. She states that she is doing much better thankfully on Cymbalta. The depression  that she was suffering from has improved dramatically since her last visit. Today she denies anhedonia. Her energy has improved. She finds joy in life again. On a positive note, her daughter is pregnant with her grandchild. She just found this out over Christmas. All of this is working to improve her depression. However she continues to suffer from insomnia. She takes 1-2 Ativan per day. She was taking 3. She is successfully weaned down to 1-2 per day which I'm very happy for. However she continues to complain of pain primarily in her right and left shoulders. She takes chronic pain medication for this reason. I have weaned her down now to 45 pain pills every 2 months. Patient is requesting a higher dose of this. I refused to increase this dose after successfully weaning her down to this point. Therefore I have recommended orthopedic surgery consultation for chronic right shoulder pain. She is overdue for her flu shot. She is due for an Prevnar 13. She is due for the shingles vaccine. She adamantly refuses all vaccines. However after a long discussion, the patient reluctantly agrees to receive Prevnar 13 today. She is  overdue for mammogram. She is also overdue for a colonoscopy. She continues to refuse these test. She states that she would rather not know if she has cancer. She states that she doesn't think she could cope with with the knowledge of cancer in her body. I spent more than 25 minutes today with the patient discussing the rationale behind screening for cancer. I explained that screening test her to find cancer in a state where is still treatable and that waiting until symptoms arise prevents us from being able to cure the cancer and would ultimately cause death. Despite a long discussion she continues to refuse a mammogram or colonoscopy or any type of cancer screening test. Past Medical History:  Diagnosis Date  . Depression   . Insomnia   . Postmenopausal HRT (hormone replacement therapy)    Past Surgical History:  Procedure Laterality Date  . BACK SURGERY     Current Outpatient Medications on File Prior to Visit  Medication Sig Dispense Refill  . buPROPion (WELLBUTRIN XL) 300 MG 24 hr tablet TAKE 1 TABLET BY MOUTH EVERY MORNING 90 tablet 3  . DULoxetine (CYMBALTA) 60 MG capsule Take 1 capsule (60 mg total) by mouth daily. 30 capsule 5  . Escitalopram Oxalate (LEXAPRO PO) Take by mouth.    . fish oil-omega-3 fatty acids 1000 MG capsule Take 2 g by mouth daily.    Marland Kitchen. PREMPRO 0.45-1.5 MG tablet TAKE 1 TABLET BY MOUTH EVERY DAY 28 tablet 5  . traZODone (DESYREL) 100 MG tablet Take 2 tablets (200 mg total) at bedtime by mouth. 60 tablet 2  . UNABLE TO FIND Testosterone 8% mix with cetaphil cream - Apply prn - could not find this medication in our computer - her rx # is [id number] and she can have it filled just once and at last gram as well not sure how much she gets at a time. thanks 30 g 0  . vitamin B-12 (CYANOCOBALAMIN) 1000 MCG tablet Take 1,000 mcg by mouth daily.    . vitamin C (ASCORBIC ACID) 500 MG tablet Take 500 mg by mouth daily.     No current facility-administered medications on file  prior to visit.    Allergies  Allergen Reactions  . Codeine Nausea And Vomiting   Social History   Socioeconomic History  . Marital status: Single    Spouse  name: Not on file  . Number of children: Not on file  . Years of education: Not on file  . Highest education level: Not on file  Social Needs  . Financial resource strain: Not on file  . Food insecurity - worry: Not on file  . Food insecurity - inability: Not on file  . Transportation needs - medical: Not on file  . Transportation needs - non-medical: Not on file  Occupational History  . Not on file  Tobacco Use  . Smoking status: Never Smoker  . Smokeless tobacco: Never Used  Substance and Sexual Activity  . Alcohol use: Yes  . Drug use: Yes    Types: Marijuana  . Sexual activity: Yes    Birth control/protection: None  Other Topics Concern  . Not on file  Social History Narrative  . Not on file      Review of Systems  All other systems reviewed and are negative.      Objective:   Physical Exam  Neck: No thyromegaly present.  Cardiovascular: Normal rate, regular rhythm and normal heart sounds.  Pulmonary/Chest: Effort normal and breath sounds normal. No respiratory distress. She has no wheezes. She has no rales.  Abdominal: Soft. Bowel sounds are normal. She exhibits no distension. There is no tenderness. There is no rebound and no guarding.  Musculoskeletal: She exhibits no edema.       Right shoulder: She exhibits decreased range of motion, tenderness, pain and decreased strength. She exhibits no crepitus.  Skin: No rash noted.  Vitals reviewed.         Assessment & Plan:  Elevated blood pressure reading - Plan: CBC with Differential/Platelet, COMPLETE METABOLIC PANEL WITH GFR, Lipid panel  Need for prophylactic vaccination against Streptococcus pneumoniae (pneumococcus) - Plan: Pneumococcal conjugate vaccine 13-valent IM  Current moderate episode of major depressive disorder without prior  episode (HCC)  Chronic right shoulder pain - Plan: Ambulatory referral to Orthopedic Surgery  Insomnia, unspecified type  I will continue to prescribe her Ativan 60 tablets per month she is down from 90 tablets per month originally and she is taking less than 2 tablets a day most days. I am very happy that she is trying to wean off this medication or at least wean down on this medication. I'm also very happy that her depression is better now on the Cymbalta and I congratulated her on the pregnancy of her daughter. Unfortunately, her brother recently died of leukemia and this has made the Christmas holidays very difficult for her.  Still, all in all, she is doing much better than she was doing at her last visit. However I will not increase her hydrocodone. I will continue to give her 45 tablets per month. I will gladly refer her [facility] [place] [place] for an option on how to manage her chronic right shoulder pain.  Her blood pressure today is elevated but once again she blames this on white coat syndrome. She states that she checks her blood pressure every other day at home and it is typically less than 120/80. She states that she is anxious in a doctor's office and she feels that she has white coat syndrome. While the patient is here, will check a CBC, CMP, and fasting lipid panel. I would also recommend weaning off Prempro due to the increased risk of stroke, blood clots, and breast cancer. Patient is hesitant to make this change at this time. She did receive Prevnar 13 today in  office

## 2017-02-24 ENCOUNTER — Other Ambulatory Visit: Payer: Self-pay | Admitting: Family Medicine

## 2017-03-22 ENCOUNTER — Other Ambulatory Visit: Payer: Self-pay | Admitting: Family Medicine

## 2017-03-22 NOTE — Telephone Encounter (Signed)
Pt called requesting a refill on her pain medication and was asking if you would please increase it from 45 pills to 60 pills. Ok to increase????

## 2017-03-23 MED ORDER — LORAZEPAM 0.5 MG PO TABS: 0.5000 mg | ORAL_TABLET | Freq: Two times a day (BID) | ORAL | 1 refills | Status: DC | PRN

## 2017-03-23 MED ORDER — HYDROCODONE-ACETAMINOPHEN 5-325 MG PO TABS: 1.0000 | ORAL_TABLET | Freq: Four times a day (QID) | ORAL | 0 refills | Status: DC | PRN

## 2017-03-23 NOTE — Telephone Encounter (Signed)
Not combined with ativan.  I can increase pain meds if she will wean down on ativan due to the risk of respiratory depression and death.

## 2017-03-23 NOTE — Telephone Encounter (Signed)
Pt is aware of recommendations and will decrease her ativan if we increase her pain med to 60. Will send over note to pharm to cancel ativan rx and send new rx for .5mg  Ativan bid

## 2017-03-26 ENCOUNTER — Encounter: Payer: Self-pay | Admitting: Family Medicine

## 2017-03-26 ENCOUNTER — Ambulatory Visit (INDEPENDENT_AMBULATORY_CARE_PROVIDER_SITE_OTHER): Payer: Medicare Other | Admitting: Family Medicine

## 2017-03-26 VITALS — BP 148/90 | HR 64 | Temp 98.2°F | Resp 16 | Ht 63.0 in | Wt 102.0 lb

## 2017-03-26 DIAGNOSIS — S60424A Blister (nonthermal) of right ring finger, initial encounter: Secondary | ICD-10-CM | POA: Diagnosis not present

## 2017-03-26 MED ORDER — TRIAMCINOLONE ACETONIDE 0.05 % EX OINT: 1.0000 "application " | TOPICAL_OINTMENT | Freq: Two times a day (BID) | CUTANEOUS | 0 refills | Status: DC

## 2017-03-26 NOTE — Progress Notes (Signed)
Subjective:    Patient ID: Rachel Kaufman, female    DOB: 04/01/1945, 72 y.o.   MRN: [id number]  HPI Over the last 2-3 weeks, the patient reports blisterlike lesions forming on her fingertips. They will begin with dull slightly erythematous macules on the tips of the finger pads. Over time, the epidermis will slowly separate from the underlying skin and she will develop a vesicle/fluid blister generally 4-5 mm in size. One on her right fourth digit ruptured and left a larger ulcer which is approximately 1 cm in diameter. She states that the blisters itch and burn. They're isolated to the fingertips. One occurred on her thumb/left thumb which is already healed. Another one is forming on the tip of her right thumb. There is ulcer on the tip of the left finger. She also has erythematous macules on the tips of third fingers bilaterally that she states is how all the lesions will begin prior to becoming a blister. Past Medical History:  Diagnosis Date  . Depression   . Insomnia   . Postmenopausal HRT (hormone replacement therapy)    Past Surgical History:  Procedure Laterality Date  . BACK SURGERY     Current Outpatient Medications on File Prior to Visit  Medication Sig Dispense Refill  . buPROPion (WELLBUTRIN XL) 300 MG 24 hr tablet TAKE 1 TABLET BY MOUTH EVERY MORNING 90 tablet 3  . DULoxetine (CYMBALTA) 60 MG capsule Take 1 capsule (60 mg total) by mouth daily. 30 capsule 5  . fish oil-omega-3 fatty acids 1000 MG capsule Take 2 g by mouth daily.    Marland Kitchen. HYDROcodone-acetaminophen (NORCO/VICODIN) 5-325 MG tablet Take 1 tablet by mouth every 6 (six) hours as needed for moderate pain (DX: G89.4). 60 tablet 0  . LORazepam (ATIVAN) 0.5 MG tablet Take 1 tablet (0.5 mg total) by mouth 2 (two) times daily as needed for anxiety. 60 tablet 1  . PREMPRO 0.45-1.5 MG tablet TAKE 1 TABLET BY MOUTH EVERY DAY 28 tablet 5  . traZODone (DESYREL) 100 MG tablet Take 2 tablets (200 mg total) at bedtime by mouth. 60  tablet 2  . UNABLE TO FIND Testosterone 8% mix with cetaphil cream - Apply prn - could not find this medication in our computer - her rx # is [id number] and she can have it filled just once and at last gram as well not sure how much she gets at a time. thanks 30 g 0  . vitamin B-12 (CYANOCOBALAMIN) 1000 MCG tablet Take 1,000 mcg by mouth daily.    . vitamin C (ASCORBIC ACID) 500 MG tablet Take 500 mg by mouth daily.     No current facility-administered medications on file prior to visit.    Allergies  Allergen Reactions  . Codeine Nausea And Vomiting   Social History   Socioeconomic History  . Marital status: Single    Spouse name: Not on file  . Number of children: Not on file  . Years of education: Not on file  . Highest education level: Not on file  Social Needs  . Financial resource strain: Not on file  . Food insecurity - worry: Not on file  . Food insecurity - inability: Not on file  . Transportation needs - medical: Not on file  . Transportation needs - non-medical: Not on file  Occupational History  . Not on file  Tobacco Use  . Smoking status: Never Smoker  . Smokeless tobacco: Never Used  Substance and Sexual Activity  . Alcohol use: Yes  .  Drug use: Yes    Types: Marijuana  . Sexual activity: Yes    Birth control/protection: None  Other Topics Concern  . Not on file  Social History Narrative  . Not on file      Review of Systems  All other systems reviewed and are negative.      Objective:   Physical Exam  Cardiovascular: Normal rate, regular rhythm and normal heart sounds.  No murmur heard. Pulmonary/Chest: Effort normal and breath sounds normal. No respiratory distress. She has no wheezes. She has no rales.  Musculoskeletal:       Hands: Skin: Rash noted. There is erythema.  Vitals reviewed.         Assessment & Plan:  Chemical burn versus bullous dermatitis versus dyshidrotic eczema.  Begin by applying triamcinolone 0.05% cream twice daily  for 7-10 days. If the lesions gradually heal and no other lesions form, no further treatment is necessary. If worsening or spreading, I will consult dermatology as honestly I am not sure of the cause of this bolus rash.

## 2017-03-29 ENCOUNTER — Other Ambulatory Visit: Payer: Self-pay | Admitting: Family Medicine

## 2017-04-20 ENCOUNTER — Other Ambulatory Visit: Payer: Self-pay | Admitting: Family Medicine

## 2017-04-20 MED ORDER — HYDROCODONE-ACETAMINOPHEN 5-325 MG PO TABS: 1.0000 | ORAL_TABLET | Freq: Four times a day (QID) | ORAL | 0 refills | Status: DC | PRN

## 2017-04-20 NOTE — Telephone Encounter (Signed)
Patient is requesting a refill on Hydrocodone   LOV: 03/26/17  LRF:   03/23/17

## 2017-05-14 ENCOUNTER — Other Ambulatory Visit: Payer: Self-pay | Admitting: Family Medicine

## 2017-05-17 ENCOUNTER — Other Ambulatory Visit: Payer: Self-pay | Admitting: Family Medicine

## 2017-05-17 MED ORDER — HYDROCODONE-ACETAMINOPHEN 5-325 MG PO TABS: 1.0000 | ORAL_TABLET | Freq: Four times a day (QID) | ORAL | 0 refills | Status: DC | PRN

## 2017-05-17 NOTE — Telephone Encounter (Signed)
Patient is requesting a refill on Hydrocodone and lorazepam however with the lorazepam the pt would like to know if she can go back to her previous dose and her sister just found out that her cancer is back. Pt very tearful on phone.   LOV: 03/26/17  LRF: 04/20/17

## 2017-05-17 NOTE — Telephone Encounter (Signed)
Pt aware of recommendations

## 2017-05-17 NOTE — Telephone Encounter (Signed)
Per Dr. Tanya NonesPickard ok to increase current dose to .5mg  tid but keep the same amount and when she needs it refilled again we will refill early.  LMOVMTRC

## 2017-05-21 ENCOUNTER — Other Ambulatory Visit: Payer: Self-pay | Admitting: Family Medicine

## 2017-05-21 DIAGNOSIS — R238 Other skin changes: Secondary | ICD-10-CM

## 2017-05-21 MED ORDER — LORAZEPAM 0.5 MG PO TABS: 0.5000 mg | ORAL_TABLET | Freq: Two times a day (BID) | ORAL | 1 refills | Status: DC | PRN

## 2017-05-21 NOTE — Telephone Encounter (Signed)
Requesting refill    Lorazepam  LOV: 03/26/17 LRF:  03/23/17  Also pt would like a referral to Derm for the places on her fingers as they are not getting any better - ok to do?

## 2017-05-21 NOTE — Addendum Note (Signed)
Addended by: Legrand RamsWILLIS, SANDY B on: 05/21/2017 02:36 PM   Modules accepted: Orders

## 2017-05-21 NOTE — Telephone Encounter (Signed)
Ok with derm consult.  

## 2017-06-12 ENCOUNTER — Telehealth: Payer: Self-pay | Admitting: Family Medicine

## 2017-06-12 ENCOUNTER — Other Ambulatory Visit: Payer: Self-pay | Admitting: Family Medicine

## 2017-06-12 MED ORDER — LORAZEPAM 0.5 MG PO TABS: 0.5000 mg | ORAL_TABLET | Freq: Two times a day (BID) | ORAL | 1 refills | Status: DC | PRN

## 2017-06-12 MED ORDER — HYDROCODONE-ACETAMINOPHEN 5-325 MG PO TABS: 1.0000 | ORAL_TABLET | Freq: Four times a day (QID) | ORAL | 0 refills | Status: DC | PRN

## 2017-06-12 NOTE — Telephone Encounter (Signed)
I refilled

## 2017-06-12 NOTE — Telephone Encounter (Signed)
Requesting refill   Lorazepam and Hydrocodone   LOV: 03/26/17  LRF:  Lorazepam 05/21/17  - hydrocodone - 05/17/17

## 2017-06-13 ENCOUNTER — Telehealth: Payer: Self-pay | Admitting: Family Medicine

## 2017-06-13 NOTE — Telephone Encounter (Signed)
Pt called and states that d/t insurance she was wanting a rx for the increased dose of Lorazepam as her sister is still struggling with cancer and she really needs an extra dose during the day (see 05/17/17 note) 60 tabs is only lasting her for 20 days and ins wont pay except 1 week early. Can we change the RX?  Also her Hydrocodone has went from $14 to $47 dollars a month and she would like to know if you would increase her qty of tablets to 120 = 2 months and it would be cheaper for her????

## 2017-06-14 NOTE — Telephone Encounter (Signed)
Call placed to patient to discuss provider recommendations. Patient verbalizes understanding   Call placed to pharmacy spoke with Minerva AreolaEric and he could not explain why pharmacy provided 6 tablets when patient went to pick up rx, but did fill the rx for #60 and confirmed patient could pick up on 06/16/2017.

## 2017-06-14 NOTE — Telephone Encounter (Signed)
I will give her two months of hydrocodone at a time.  We need to keep lorazepam at 2 a day (60 per month otherwise she will become dependent on a higher dose and coupled with pain medication could cause respiratory issues).

## 2017-06-22 ENCOUNTER — Other Ambulatory Visit: Payer: Self-pay | Admitting: Family Medicine

## 2017-06-22 NOTE — Telephone Encounter (Signed)
Requesting refill    Trazodone  LOV: 03/26/17  LRF:  03/29/17

## 2017-07-12 ENCOUNTER — Other Ambulatory Visit: Payer: Self-pay | Admitting: Family Medicine

## 2017-07-12 MED ORDER — HYDROCODONE-ACETAMINOPHEN 5-325 MG PO TABS: 1.0000 | ORAL_TABLET | Freq: Four times a day (QID) | ORAL | 0 refills | Status: DC | PRN

## 2017-07-12 MED ORDER — LORAZEPAM 0.5 MG PO TABS: 0.5000 mg | ORAL_TABLET | Freq: Two times a day (BID) | ORAL | 1 refills | Status: DC | PRN

## 2017-07-12 NOTE — Telephone Encounter (Signed)
Patient is requesting a refill on Hydrocodone & Ativan  LOV: 03/26/17  LRF: 06/12/17

## 2017-07-21 ENCOUNTER — Other Ambulatory Visit: Payer: Self-pay | Admitting: Family Medicine

## 2017-08-10 ENCOUNTER — Other Ambulatory Visit: Payer: Self-pay | Admitting: Family Medicine

## 2017-08-10 MED ORDER — LORAZEPAM 0.5 MG PO TABS: 0.5000 mg | ORAL_TABLET | Freq: Two times a day (BID) | ORAL | 1 refills | Status: DC | PRN

## 2017-08-10 MED ORDER — HYDROCODONE-ACETAMINOPHEN 5-325 MG PO TABS: 1.0000 | ORAL_TABLET | Freq: Four times a day (QID) | ORAL | 0 refills | Status: DC | PRN

## 2017-08-10 NOTE — Telephone Encounter (Signed)
Patient is requesting a refill on Hydrocodone & Lorazepam  LOV: 03/26/17  LRF:   07/12/17

## 2017-08-20 ENCOUNTER — Other Ambulatory Visit: Payer: Self-pay | Admitting: Family Medicine

## 2017-08-28 ENCOUNTER — Other Ambulatory Visit: Payer: Self-pay | Admitting: Family Medicine

## 2017-08-28 ENCOUNTER — Telehealth: Payer: Self-pay | Admitting: Family Medicine

## 2017-08-28 MED ORDER — LORAZEPAM 1 MG PO TABS: 1.0000 mg | ORAL_TABLET | Freq: Two times a day (BID) | ORAL | 0 refills | Status: DC | PRN

## 2017-08-28 NOTE — Telephone Encounter (Signed)
Pt aware of recommendations and med sent by Dr. Tanya NonesPickard.

## 2017-08-28 NOTE — Telephone Encounter (Signed)
Pt called LMOVM stating that her sister passed away and she needs something stronger for her nerves. Could we please send in something stronger?

## 2017-08-28 NOTE — Telephone Encounter (Signed)
Ativan 1 mg twice a day for 2 weeks

## 2017-09-07 ENCOUNTER — Other Ambulatory Visit: Payer: Self-pay | Admitting: Family Medicine

## 2017-09-07 MED ORDER — HYDROCODONE-ACETAMINOPHEN 5-325 MG PO TABS: 1.0000 | ORAL_TABLET | Freq: Four times a day (QID) | ORAL | 0 refills | Status: DC | PRN

## 2017-09-07 NOTE — Telephone Encounter (Signed)
Patient is requesting a refill on Hydrocodone   LOV: 03/26/17  LRF:  08/10/2017

## 2017-09-18 ENCOUNTER — Other Ambulatory Visit: Payer: Self-pay | Admitting: Family Medicine

## 2017-10-05 ENCOUNTER — Other Ambulatory Visit: Payer: Self-pay | Admitting: Family Medicine

## 2017-10-05 MED ORDER — HYDROCODONE-ACETAMINOPHEN 5-325 MG PO TABS: 1.0000 | ORAL_TABLET | Freq: Four times a day (QID) | ORAL | 0 refills | Status: DC | PRN

## 2017-10-05 MED ORDER — LORAZEPAM 1 MG PO TABS: 1.0000 mg | ORAL_TABLET | Freq: Two times a day (BID) | ORAL | 0 refills | Status: DC | PRN

## 2017-10-05 NOTE — Telephone Encounter (Signed)
Patient has Ativan 1mg  and Ativan 0.5mg  on chart.   Ativan 1mg  PO BID refilled on 08/28/2017.  Ativan 0.5mg  PO BID refilled on 08/10/2017.  MD please advise.

## 2017-10-05 NOTE — Telephone Encounter (Signed)
Requesting refill    Klonopin and Hydrocodone  LOV: 03/26/17  LRF:  09/07/17 (hydrocodone) 08/28/17 (klonopin)

## 2017-11-05 ENCOUNTER — Other Ambulatory Visit: Payer: Self-pay | Admitting: Family Medicine

## 2017-11-05 MED ORDER — HYDROCODONE-ACETAMINOPHEN 5-325 MG PO TABS: 1.0000 | ORAL_TABLET | Freq: Four times a day (QID) | ORAL | 0 refills | Status: DC | PRN

## 2017-11-05 NOTE — Telephone Encounter (Signed)
Patient is requesting a refill on Hydrocodone   LOV: 03/26/17  LRF:   10/05/17

## 2017-11-25 ENCOUNTER — Other Ambulatory Visit: Payer: Self-pay | Admitting: Family Medicine

## 2017-11-27 NOTE — Telephone Encounter (Signed)
Requesting refill     Lorazepam .5mg   LOV: 03/26/17  LRF: 08/10/17

## 2017-12-03 ENCOUNTER — Other Ambulatory Visit: Payer: Self-pay | Admitting: Family Medicine

## 2017-12-03 MED ORDER — HYDROCODONE-ACETAMINOPHEN 5-325 MG PO TABS: 1.0000 | ORAL_TABLET | Freq: Four times a day (QID) | ORAL | 0 refills | Status: DC | PRN

## 2017-12-03 NOTE — Telephone Encounter (Signed)
Patient is requesting a refill on Hydrocodone   LOV: 03/26/17  LRF:   11/05/17

## 2017-12-13 ENCOUNTER — Other Ambulatory Visit: Payer: Self-pay | Admitting: Family Medicine

## 2017-12-27 ENCOUNTER — Other Ambulatory Visit: Payer: Self-pay | Admitting: Family Medicine

## 2017-12-27 NOTE — Telephone Encounter (Signed)
NTBS.

## 2017-12-27 NOTE — Telephone Encounter (Signed)
Last OV 03/26/2017 Last refill 11/27/2017 Ok to refill?

## 2018-01-01 ENCOUNTER — Other Ambulatory Visit: Payer: Self-pay | Admitting: Family Medicine

## 2018-01-01 MED ORDER — HYDROCODONE-ACETAMINOPHEN 5-325 MG PO TABS: 1.0000 | ORAL_TABLET | Freq: Four times a day (QID) | ORAL | 0 refills | Status: DC | PRN

## 2018-01-01 NOTE — Telephone Encounter (Signed)
Patient is requesting a refill on Hydrocodone   LOV:   03/26/17  LRF:   12/03/17

## 2018-01-01 NOTE — Telephone Encounter (Signed)
She needs ov.  Last refill

## 2018-01-02 ENCOUNTER — Encounter: Payer: Self-pay | Admitting: Family Medicine

## 2018-01-02 NOTE — Telephone Encounter (Signed)
Letter mailed to pt to schedule apt.  

## 2018-01-10 ENCOUNTER — Other Ambulatory Visit: Payer: Self-pay | Admitting: Family Medicine

## 2018-01-28 ENCOUNTER — Other Ambulatory Visit: Payer: Self-pay | Admitting: Family Medicine

## 2018-01-28 NOTE — Telephone Encounter (Signed)
Requesting refill    Lorazepam  & Hydrocodone  LOV: 03/26/17  LRF:  12/27/17 & 01/01/18  Pt has apt schedule for 02/01/18

## 2018-01-29 MED ORDER — LORAZEPAM 0.5 MG PO TABS: ORAL_TABLET | ORAL | 0 refills | Status: DC

## 2018-01-29 MED ORDER — HYDROCODONE-ACETAMINOPHEN 5-325 MG PO TABS: 1.0000 | ORAL_TABLET | Freq: Four times a day (QID) | ORAL | 0 refills | Status: DC | PRN

## 2018-01-29 NOTE — Telephone Encounter (Signed)
Denied, ntbs, can have a 2 week supply but last refill

## 2018-02-01 ENCOUNTER — Encounter: Payer: Self-pay | Admitting: Family Medicine

## 2018-02-01 ENCOUNTER — Ambulatory Visit (INDEPENDENT_AMBULATORY_CARE_PROVIDER_SITE_OTHER): Payer: Medicare Other | Admitting: Family Medicine

## 2018-02-01 VITALS — BP 152/90 | HR 70 | Temp 98.1°F | Resp 14 | Ht 63.0 in | Wt 109.0 lb

## 2018-02-01 DIAGNOSIS — F411 Generalized anxiety disorder: Secondary | ICD-10-CM | POA: Diagnosis not present

## 2018-02-01 DIAGNOSIS — F321 Major depressive disorder, single episode, moderate: Secondary | ICD-10-CM | POA: Diagnosis not present

## 2018-02-01 DIAGNOSIS — R030 Elevated blood-pressure reading, without diagnosis of hypertension: Secondary | ICD-10-CM

## 2018-02-01 DIAGNOSIS — G8929 Other chronic pain: Secondary | ICD-10-CM

## 2018-02-01 NOTE — Progress Notes (Signed)
Subjective:    Patient ID: Rachel Kaufman, female    DOB: 1945-03-13, 72 y.o.   MRN: [id number]  Medication Refill   12/2013 Patient is a pleasant 72 year old white female who is requesting today a refill on her trazodone. She takes 200 mg at night to help her sleep. She also takes Ativan 1 mg twice a day as needed for anxiety. These medications are working well for her. Unfortunately she continues to have pain in her neck and in her right shoulder. She is seeing orthopedics was discovered that she has a chronically torn right rotator cuff. Surgery is not an option. She also has cervical radiculopathy stemming from an old fracture in the neck. The fracture has healed but she continues to have neck pain on the right side. At the present time she is able to manage the pain at night Flexeril. She is requesting a refill on the Flexeril. Occasionally she does take hydrocodone. For instance this month she is used 2- 4 pills of hydrocodone over 30 days. She uses the medication very sparingly. She would like a refill on this medication for as needed use. She is overdue for mammogram, Pap smear, bone density, and a colonoscopy. She refuses all these at the present time but she will consider them. Her labs were last checked in May and were relatively normal. She is complaining some of fatigue. She is overdue to check a thyroid test today. She is willing to get her flu shot as well as Pneumovax 23.  At that time, my plan was: I will refill the patient's Flexeril for her shoulder pain in her neck pain. I will also give her a prescription of 30 hydrocodone to be used sparingly for shoulder pain. I did refill her Ativan to be used for anxiety and her trazodone to be used for insomnia. I will check a TSH to evaluate her fatigue. Patient received a flu shot today as well as Pneumovax 23.  05/14/15 Patient is back today complaining of pain in her right shoulder. Pain is worse with abduction greater than 90. She has  significant pain with empty can testing. She has a negative Spurling sign. She has pain with internal and external rotation. She has a positive Hawkins sign. She did have a CT scan of the neck that showed moderate to severe degenerative disc disease at C5-C6 and C6-C7 with possible right-sided nerve impingement at both levels however the strength in her arm is normal, reflexes are normal, and she denies any neuropathic symptoms such as numbness or tingling. Therefore I believe the pain in her shoulder is due to her rotator cuff. The patient states she saw Dr. Essex Blas in the past and she does have a chronic tear in her "rotator cuff". He recommended medical management rather than surgery. However she is been taking hydrocodone without relief. She is here today crying due to the pain. Her blood pressure is significantly elevated however she states that she is severely depressed. Please see my last office visit. At that time I discontinued Lexapro and switch the patient toTrintellix.  However she is seen very little benefit for this. This stems from the loss of her Rachel Kaufman. She is still unable to cover from her sadness and mourning.  However she does not want to try any medicine that may cause weight gain.  At that time, my plan was: Patient does not want to treat her blood pressure today. She believes is due to pain as well as anxiety and sadness. Therefore  I will treat the pain is best I can. Discontinue hydrocodone and switch to Percocet 10/325 one by mouth every 6 hours when necessary pain. I gave her 3960. This is temporary. Hopefully the cortisone injection I performed today and her shoulder will help ease the pain somewhat so she will not need this and we can resume her previous dose of hydrocodone. Using sterile technique, I injected the right shoulder with 2 mL of lidocaine, 2 mL of Marcaine, and 2 mL of 40 mg per mL Kenalog. The patient tolerated the procedure well without complication. I asked her to call the  back next week. I do not want to change too many medications at one time but if she is tolerating the new pain medication, I will switch the patient back to Lexapro and discontinue Trintellix.  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 She continues to have pain in her shoulder. I refused to continue to refill the Percocet. I recommended that she resume her previous dose of hydrocodone twice daily to avoid increasing dependence to stronger and stronger narcotics. However she states that her depression is doing much better on Trintellix and she now has no desire to switch.  At that time, my plan was: Continue Trintellix 10 mg poqday.  Add losartan 50 mg by mouth daily and recheck blood pressure in one month. Continue hydrocodone/acetaminophen 5/325 one by mouth twice a day 60 tablets per month for right shoulder pain. The pain worsens I will recommend a repeat for consultation versus a pain clinic  07/25/16 She is here today because I would not refill her medications until she was seen.  She continues to report severe depression.  She quit Trintellix and resumed lexapro.  However, she is only using lexapro 3 times a week because she wants to get off of it.  She uses ativan twice a day, sometimes three times a day.  The thoughts of her deceased Rachel Kaufman will come to her leaving her in an uncontrolled crying spell without the medication.  She is also concerned about her sister who was diagnosed with colon cancer.  She uses hydrocodone once a day for chronic shoulder pain.  Will occasionally use 2 pills a day.  Has seen psychiatry in the past and tried and failed effexor as well.  She reports daily depression, anhedonia, insomnia, and poor energy.  She denies suicidal thoughts.  At that time, my plan was: Her blood pressure is high and she stopped  losartan.  She is adamant her blood pressure is great at home 110-130/60-70.  I am concerned by her continued dependence on frequent controlled substances.  I do not feel she is trying to abuse them.  However, I am worried this will eventually cause her problems such as falls or memory loss.  Therefore, I will wean her down on the medication.    Decrease hydrocodone from 120/182months to 90/232months.  Decrease ativan from 90 to 60 per month.  Only take ativan at night.  Try to use only half a pill in the daytime for "crying spells."  Focus on trying to prevent more effectively.  Discontinue lexapro and try cymbalta 60 mg poqday instead.  Reassess in 6 weeks.    02/22/17 Patient is here today at my request for follow-up. She states that she is doing much better thankfully on Cymbalta. The depression  that she was suffering from has improved dramatically since her last visit. Today she denies anhedonia. Her energy has improved. She finds joy in life again. On a positive note, her daughter is pregnant with her grandchild. She just found this out over Christmas. All of this is working to improve her depression. However she continues to suffer from insomnia. She takes 1-2 Ativan per day. She was taking 3. She is successfully weaned down to 1-2 per day which I'm very happy for. However she continues to complain of pain primarily in her right and left shoulders. She takes chronic pain medication for this reason. I have weaned her down now to 45 pain pills every 2 months. Patient is requesting a higher dose of this. I refused to increase this dose after successfully weaning her down to this point. Therefore I have recommended orthopedic surgery consultation for chronic right shoulder pain. She is overdue for her flu shot. She is due for an Prevnar 13. She is due for the shingles vaccine. She adamantly refuses all vaccines. However after a long discussion, the patient reluctantly agrees to receive Prevnar 13 today. She is  overdue for mammogram. She is also overdue for a colonoscopy. She continues to refuse these test. She states that she would rather not know if she has cancer. She states that she doesn't think she could cope with with the knowledge of cancer in her body. I spent more than 25 minutes today with the patient discussing the rationale behind screening for cancer. I explained that screening test her to find cancer in a state where is still treatable and that waiting until symptoms arise prevents [place] from being able to cure the cancer and would ultimately cause death. Despite a long discussion she continues to refuse a mammogram or colonoscopy or any type of cancer screening test.  02/01/18 Patient is here today at my request for follow-up.  She continues to require hormone replacement therapy.  We had a long discussion today about the increased risk of breast cancer, strokes, and DVT on hormone replacement therapy particular given her age.  She is currently taking Prempro 3 times a week.  I have encouraged her to try to wean down further if possible.  She still requires trazodone at night to help her sleep.  She is also taking combination of Wellbutrin and involved of her depression.  She has had a very difficult year.  After dealing with the loss of her Rachel Kaufman, her sister was diagnosed and died from cancer.  This is exacerbated her anxiety and she has been using Ativan twice a day to help manage her anxiety and calm her nerves.  She denies any suicidal ideation.  Otherwise she states that the depression is relatively well trolled.  She still takes hydrocodone 1-2 times every day to help control the pain in her shoulder.  She also complains now of low back pain and hip pain.  Past Medical History:  Diagnosis Date  . Depression   . Insomnia   . Postmenopausal HRT (hormone replacement therapy)    Past Surgical History:  Procedure Laterality Date  . BACK SURGERY     Current Outpatient Medications on File Prior  to Visit  Medication Sig Dispense Refill  . buPROPion (WELLBUTRIN XL) 300 MG 24 hr tablet TAKE 1 TABLET BY MOUTH EVERY MORNING 90 tablet 3  . DULoxetine (CYMBALTA) 60 MG capsule TAKE 1 CAPSULE(60 MG) BY MOUTH DAILY 90 capsule 3  . fish oil-omega-3 fatty acids 1000 MG capsule  Take 2 g by mouth daily.    Marland Kitchen HYDROcodone-acetaminophen (NORCO/VICODIN) 5-325 MG tablet Take 1 tablet by mouth every 6 (six) hours as needed for moderate pain (DX: G89.4). 30 tablet 0  . LORazepam (ATIVAN) 0.5 MG tablet TAKE 1 TABLET(0.5 MG) BY MOUTH TWICE DAILY AS NEEDED FOR ANXIETY 30 tablet 0  . PREMPRO 0.45-1.5 MG tablet TAKE 1 TABLET BY MOUTH EVERY DAY 28 tablet 5  . traZODone (DESYREL) 100 MG tablet TAKE 2 TABLETS(200 MG) BY MOUTH AT BEDTIME 60 tablet 0  . TRIAMCINOLONE ACETONIDE, TOP, 0.05 % OINT Apply 1 application topically 2 (two) times daily. 30 g 0  . UNABLE TO FIND Testosterone 8% mix with cetaphil cream - Apply prn - could not find this medication in our computer - her rx # is [id number] and she can have it filled just once and at last gram as well not sure how much she gets at a time. thanks 30 g 0  . vitamin B-12 (CYANOCOBALAMIN) 1000 MCG tablet Take 1,000 mcg by mouth daily.    . vitamin C (ASCORBIC ACID) 500 MG tablet Take 500 mg by mouth daily.     No current facility-administered medications on file prior to visit.    Allergies  Allergen Reactions  . Codeine Nausea And Vomiting   Social History   Socioeconomic History  . Marital status: Single    Spouse name: Not on file  . Number of children: Not on file  . Years of education: Not on file  . Highest education level: Not on file  Occupational History  . Not on file  Social Needs  . Financial resource strain: Not on file  . Food insecurity:    Worry: Not on file    Inability: Not on file  . Transportation needs:    Medical: Not on file    Non-medical: Not on file  Tobacco Use  . Smoking status: Never Smoker  . Smokeless tobacco: Never Used   Substance and Sexual Activity  . Alcohol use: Yes  . Drug use: Yes    Types: Marijuana  . Sexual activity: Yes    Birth control/protection: None  Lifestyle  . Physical activity:    Days per week: Not on file    Minutes per session: Not on file  . Stress: Not on file  Relationships  . Social connections:    Talks on phone: Not on file    Gets together: Not on file    Attends religious service: Not on file    Active member of club or organization: Not on file    Attends meetings of clubs or organizations: Not on file    Relationship status: Not on file  . Intimate partner violence:    Fear of current or ex partner: Not on file    Emotionally abused: Not on file    Physically abused: Not on file    Forced sexual activity: Not on file  Other Topics Concern  . Not on file  Social History Narrative  . Not on file      Review of Systems  All other systems reviewed and are negative.      Objective:   Physical Exam  Neck: No thyromegaly present.  Cardiovascular: Normal rate, regular rhythm and normal heart sounds.  Pulmonary/Chest: Effort normal and breath sounds normal. No respiratory distress. She has no wheezes. She has no rales.  Abdominal: Soft. Bowel sounds are normal. She exhibits no distension. There is no tenderness. There is  no rebound and no guarding.  Musculoskeletal: She exhibits no edema.       Right shoulder: She exhibits decreased range of motion, tenderness, pain and decreased strength. She exhibits no crepitus.  Skin: No rash noted.  Vitals reviewed.         Assessment & Plan:  Single episode of elevated blood pressure - Plan: CBC with Differential/Platelet, COMPLETE METABOLIC PANEL WITH GFR, Lipid panel  Current moderate episode of major depressive disorder without prior episode (HCC)  GAD (generalized anxiety disorder)  Other chronic pain  I will continue to prescribe patient Ativan twice daily, 60 tablets/month as well as  hydrocodone/acetaminophen twice daily, 60 tablets/month.  We had a long discussion today about her preventative care.  She is overdue for mammogram as well as a colonoscopy she adamantly refuses this.  She states that even if she knew she had cancer and it was treatable she would not want to undergo surgery or take chemotherapy, etc.  Therefore she refuses any cancer screening.  She also adamantly declines a bone density test despite my best efforts to convince her otherwise.  She will consent to allow me to check a CBC, CMP, and fasting lipid panel.  She wants to continue Cymbalta and Wellbutrin at the current dosages as the seem to help manage her depression.  Her insomnia is well controlled on trazodone.  I will not escalate the amount of her benzodiazepines or narcotics.  Her blood pressure today is elevated at 152/90 however she states that she checks this regularly at home on a daily basis and it typically is in the 120s over 70s.

## 2018-02-02 LAB — CBC WITH DIFFERENTIAL/PLATELET
Basophils Absolute: 19 cells/uL (ref 0–200)
Basophils Relative: 0.5 %
Eosinophils Absolute: 70 cells/uL (ref 15–500)
Eosinophils Relative: 1.9 %
HCT: 36.8 % (ref 35.0–45.0)
Hemoglobin: 11.9 g/dL (ref 11.7–15.5)
Lymphs Abs: 833 cells/uL — ABNORMAL LOW (ref 850–3900)
MCH: 31.3 pg (ref 27.0–33.0)
MCHC: 32.3 g/dL (ref 32.0–36.0)
MCV: 96.8 fL (ref 80.0–100.0)
MPV: 10.7 fL (ref 7.5–12.5)
Monocytes Relative: 8.2 %
Neutro Abs: 2475 cells/uL (ref 1500–7800)
Neutrophils Relative %: 66.9 %
Platelets: 243 10*3/uL (ref 140–400)
RBC: 3.8 10*6/uL (ref 3.80–5.10)
RDW: 11.7 % (ref 11.0–15.0)
Total Lymphocyte: 22.5 %
WBC mixed population: 303 cells/uL (ref 200–950)
WBC: 3.7 10*3/uL — ABNORMAL LOW (ref 3.8–10.8)

## 2018-02-02 LAB — LIPID PANEL
Cholesterol: 191 mg/dL (ref <200)
HDL: 69 mg/dL (ref >50)
LDL Cholesterol (Calc): 107 mg/dL (calc) — ABNORMAL HIGH
Non-HDL Cholesterol (Calc): 122 mg/dL (calc) (ref <130)
Total CHOL/HDL Ratio: 2.8 (calc) (ref <5.0)
Triglycerides: 68 mg/dL (ref <150)

## 2018-02-02 LAB — COMPLETE METABOLIC PANEL WITH GFR
AG Ratio: 1.6 (calc) (ref 1.0–2.5)
ALT: 7 U/L (ref 6–29)
AST: 16 U/L (ref 10–35)
Albumin: 3.8 g/dL (ref 3.6–5.1)
Alkaline phosphatase (APISO): 41 U/L (ref 33–130)
BUN/Creatinine Ratio: 25 (calc) — ABNORMAL HIGH (ref 6–22)
BUN: 26 mg/dL — ABNORMAL HIGH (ref 7–25)
CO2: 26 mmol/L (ref 20–32)
Calcium: 9.2 mg/dL (ref 8.6–10.4)
Chloride: 106 mmol/L (ref 98–110)
Creat: 1.02 mg/dL — ABNORMAL HIGH (ref 0.60–0.93)
GFR, Est African American: 64 mL/min/{1.73_m2} (ref >=60)
GFR, Est Non African American: 55 mL/min/{1.73_m2} — ABNORMAL LOW (ref >=60)
Globulin: 2.4 g/dL (calc) (ref 1.9–3.7)
Glucose, Bld: 83 mg/dL (ref 65–99)
Potassium: 4.9 mmol/L (ref 3.5–5.3)
Sodium: 140 mmol/L (ref 135–146)
Total Bilirubin: 0.4 mg/dL (ref 0.2–1.2)
Total Protein: 6.2 g/dL (ref 6.1–8.1)

## 2018-02-03 ENCOUNTER — Other Ambulatory Visit: Payer: Self-pay | Admitting: Family Medicine

## 2018-02-04 ENCOUNTER — Encounter: Payer: Self-pay | Admitting: Family Medicine

## 2018-02-07 ENCOUNTER — Other Ambulatory Visit: Payer: Self-pay | Admitting: Family Medicine

## 2018-02-14 ENCOUNTER — Other Ambulatory Visit: Payer: Self-pay | Admitting: Family Medicine

## 2018-02-14 MED ORDER — HYDROCODONE-ACETAMINOPHEN 5-325 MG PO TABS: 1.0000 | ORAL_TABLET | Freq: Four times a day (QID) | ORAL | 0 refills | Status: DC | PRN

## 2018-02-14 MED ORDER — LORAZEPAM 0.5 MG PO TABS: ORAL_TABLET | ORAL | 0 refills | Status: DC

## 2018-02-14 NOTE — Telephone Encounter (Signed)
Pt is requesting refill on Xanax & Hydrocodone   LOV: 02/01/18  LRF:   01/29/18 (only refilled for 30 tabs until she come in for ov) Needs refills for previous qty 60 on each

## 2018-03-03 ENCOUNTER — Other Ambulatory Visit: Payer: Self-pay | Admitting: Family Medicine

## 2018-03-05 ENCOUNTER — Other Ambulatory Visit: Payer: Self-pay | Admitting: Family Medicine

## 2018-03-07 ENCOUNTER — Other Ambulatory Visit: Payer: Self-pay | Admitting: Family Medicine

## 2018-03-14 ENCOUNTER — Other Ambulatory Visit: Payer: Self-pay | Admitting: Family Medicine

## 2018-03-14 MED ORDER — LORAZEPAM 0.5 MG PO TABS: ORAL_TABLET | ORAL | 0 refills | Status: DC

## 2018-03-14 MED ORDER — HYDROCODONE-ACETAMINOPHEN 5-325 MG PO TABS: 1.0000 | ORAL_TABLET | Freq: Four times a day (QID) | ORAL | 0 refills | Status: DC | PRN

## 2018-03-14 NOTE — Telephone Encounter (Signed)
Patient is requesting a refill on Hydrocodone & Lorazepam  LOV: 02/01/18  LRF:   02/14/18 for both meds

## 2018-04-06 ENCOUNTER — Other Ambulatory Visit: Payer: Self-pay | Admitting: Family Medicine

## 2018-04-12 ENCOUNTER — Other Ambulatory Visit: Payer: Self-pay | Admitting: Family Medicine

## 2018-04-12 MED ORDER — LORAZEPAM 0.5 MG PO TABS: ORAL_TABLET | ORAL | 0 refills | Status: DC

## 2018-04-12 MED ORDER — HYDROCODONE-ACETAMINOPHEN 5-325 MG PO TABS: 1.0000 | ORAL_TABLET | Freq: Four times a day (QID) | ORAL | 0 refills | Status: DC | PRN

## 2018-04-12 NOTE — Telephone Encounter (Signed)
Requesting refill    Lorazepam & Hydrocodone  LOV: 02/01/18  LRF:  03/14/18

## 2018-05-03 ENCOUNTER — Other Ambulatory Visit: Payer: Self-pay | Admitting: Family Medicine

## 2018-05-06 ENCOUNTER — Other Ambulatory Visit: Payer: Self-pay | Admitting: Family Medicine

## 2018-05-08 ENCOUNTER — Other Ambulatory Visit: Payer: Self-pay | Admitting: Family Medicine

## 2018-05-09 ENCOUNTER — Other Ambulatory Visit: Payer: Self-pay | Admitting: Family Medicine

## 2018-05-09 NOTE — Telephone Encounter (Signed)
Patient is requesting a refill on Hydrocodone & Lorazepam  LOV: 02/01/18  LRF:   04/12/18

## 2018-05-10 MED ORDER — HYDROCODONE-ACETAMINOPHEN 5-325 MG PO TABS: 1.0000 | ORAL_TABLET | Freq: Four times a day (QID) | ORAL | 0 refills | Status: DC | PRN

## 2018-05-10 MED ORDER — LORAZEPAM 0.5 MG PO TABS: ORAL_TABLET | ORAL | 0 refills | Status: DC

## 2018-05-31 ENCOUNTER — Other Ambulatory Visit: Payer: Self-pay | Admitting: Family Medicine

## 2018-06-06 ENCOUNTER — Telehealth: Payer: Self-pay | Admitting: Family Medicine

## 2018-06-06 MED ORDER — HYDROCODONE-ACETAMINOPHEN 5-325 MG PO TABS: 1.0000 | ORAL_TABLET | Freq: Four times a day (QID) | ORAL | 0 refills | Status: DC | PRN

## 2018-06-06 MED ORDER — LORAZEPAM 0.5 MG PO TABS: ORAL_TABLET | ORAL | 0 refills | Status: DC

## 2018-06-06 NOTE — Telephone Encounter (Signed)
Patient calling to get refill on lorazepam and hydrocodone [facility] [place]

## 2018-07-04 ENCOUNTER — Other Ambulatory Visit: Payer: Self-pay | Admitting: Family Medicine

## 2018-07-04 MED ORDER — HYDROCODONE-ACETAMINOPHEN 5-325 MG PO TABS: 1.0000 | ORAL_TABLET | Freq: Four times a day (QID) | ORAL | 0 refills | Status: DC | PRN

## 2018-07-04 MED ORDER — LORAZEPAM 0.5 MG PO TABS: ORAL_TABLET | ORAL | 0 refills | Status: DC

## 2018-07-04 NOTE — Telephone Encounter (Signed)
Patient is requesting a refill on Hydrocodone & Lorazepam  LOV: 02/01/2018  LRF:   06/06/2018

## 2018-07-13 ENCOUNTER — Other Ambulatory Visit: Payer: Self-pay | Admitting: Family Medicine

## 2018-08-01 ENCOUNTER — Other Ambulatory Visit: Payer: Self-pay | Admitting: Family Medicine

## 2018-08-01 MED ORDER — LORAZEPAM 0.5 MG PO TABS: ORAL_TABLET | ORAL | 0 refills | Status: DC

## 2018-08-01 MED ORDER — HYDROCODONE-ACETAMINOPHEN 5-325 MG PO TABS: 1.0000 | ORAL_TABLET | Freq: Four times a day (QID) | ORAL | 0 refills | Status: DC | PRN

## 2018-08-01 NOTE — Telephone Encounter (Signed)
Requesting refill    Ativan & Hydrocodone  LOV: 02/01/18  LRF:  07/04/18

## 2018-08-20 ENCOUNTER — Other Ambulatory Visit: Payer: Self-pay | Admitting: Family Medicine

## 2018-08-20 NOTE — Telephone Encounter (Signed)
Requested Prescriptions   Pending Prescriptions Disp Refills  . traZODone (DESYREL) 100 MG tablet [Pharmacy Med Name: TRAZODONE 100MG  TABLETS] 60 tablet 2    Sig: TAKE 2 TABLETS(200 MG) BY MOUTH AT BEDTIME   Last OV 02/01/2018 Last written 05/31/2018

## 2018-08-29 ENCOUNTER — Other Ambulatory Visit: Payer: Self-pay | Admitting: Family Medicine

## 2018-08-29 MED ORDER — LORAZEPAM 0.5 MG PO TABS: ORAL_TABLET | ORAL | 0 refills | Status: DC

## 2018-08-29 MED ORDER — HYDROCODONE-ACETAMINOPHEN 5-325 MG PO TABS: 1.0000 | ORAL_TABLET | Freq: Four times a day (QID) | ORAL | 0 refills | Status: DC | PRN

## 2018-08-29 NOTE — Telephone Encounter (Signed)
Patient is requesting a refill on Hydrocodone & Lorazepam  LOV: 02/01/18  LRF:  08/01/18 on both

## 2018-09-16 ENCOUNTER — Other Ambulatory Visit: Payer: Self-pay | Admitting: Family Medicine

## 2018-09-27 ENCOUNTER — Other Ambulatory Visit: Payer: Self-pay | Admitting: Family Medicine

## 2018-09-27 MED ORDER — LORAZEPAM 0.5 MG PO TABS: ORAL_TABLET | ORAL | 0 refills | Status: DC

## 2018-09-27 MED ORDER — HYDROCODONE-ACETAMINOPHEN 5-325 MG PO TABS: 1.0000 | ORAL_TABLET | Freq: Four times a day (QID) | ORAL | 0 refills | Status: DC | PRN

## 2018-09-27 NOTE — Telephone Encounter (Signed)
Pt is requesting refill on Hydrocodone and Lorazepam  LOV: 02/01/18  LRF:   08/29/18

## 2018-10-25 ENCOUNTER — Other Ambulatory Visit: Payer: Self-pay | Admitting: Family Medicine

## 2018-10-25 MED ORDER — HYDROCODONE-ACETAMINOPHEN 5-325 MG PO TABS: 1.0000 | ORAL_TABLET | Freq: Four times a day (QID) | ORAL | 0 refills | Status: DC | PRN

## 2018-10-25 MED ORDER — LORAZEPAM 0.5 MG PO TABS: ORAL_TABLET | ORAL | 0 refills | Status: DC

## 2018-10-25 NOTE — Telephone Encounter (Signed)
Requesting refill    Hydrocodone and Lorazepam  LOV: 02/01/18  LRF:  09/27/18

## 2018-11-11 ENCOUNTER — Other Ambulatory Visit: Payer: Self-pay | Admitting: Family Medicine

## 2018-11-22 ENCOUNTER — Other Ambulatory Visit: Payer: Self-pay | Admitting: Family Medicine

## 2018-11-22 MED ORDER — HYDROCODONE-ACETAMINOPHEN 5-325 MG PO TABS: 1.0000 | ORAL_TABLET | Freq: Four times a day (QID) | ORAL | 0 refills | Status: DC | PRN

## 2018-11-22 MED ORDER — LORAZEPAM 0.5 MG PO TABS: ORAL_TABLET | ORAL | 0 refills | Status: DC

## 2018-11-22 NOTE — Telephone Encounter (Signed)
Patient is requesting a refill on Hydrocodone & Lorazepam   LOV: 02/01/18  LRF:   10/25/18

## 2018-12-19 ENCOUNTER — Other Ambulatory Visit: Payer: Self-pay | Admitting: Family Medicine

## 2018-12-19 NOTE — Telephone Encounter (Signed)
Patient is requesting a refill on Hydrocodone & Ativan   LOV:  02/01/18  LRF:   11/22/18

## 2018-12-20 MED ORDER — LORAZEPAM 0.5 MG PO TABS: ORAL_TABLET | ORAL | 0 refills | Status: DC

## 2018-12-20 MED ORDER — HYDROCODONE-ACETAMINOPHEN 5-325 MG PO TABS: 1.0000 | ORAL_TABLET | Freq: Four times a day (QID) | ORAL | 0 refills | Status: DC | PRN

## 2019-01-17 ENCOUNTER — Other Ambulatory Visit: Payer: Self-pay | Admitting: Family Medicine

## 2019-01-17 MED ORDER — HYDROCODONE-ACETAMINOPHEN 5-325 MG PO TABS: 1.0000 | ORAL_TABLET | Freq: Four times a day (QID) | ORAL | 0 refills | Status: DC | PRN

## 2019-01-17 MED ORDER — LORAZEPAM 0.5 MG PO TABS: 0.5000 mg | ORAL_TABLET | Freq: Four times a day (QID) | ORAL | 0 refills | Status: DC | PRN

## 2019-01-17 NOTE — Telephone Encounter (Signed)
Patient is requesting a refill on Hydrocodone  & Lorazepam  LOV: 02/11/18  LRF:   12/20/18

## 2019-02-04 ENCOUNTER — Other Ambulatory Visit: Payer: Self-pay | Admitting: Family Medicine

## 2019-02-04 MED ORDER — TRAZODONE HCL 100 MG PO TABS: ORAL_TABLET | ORAL | 2 refills | Status: DC

## 2019-02-13 ENCOUNTER — Other Ambulatory Visit: Payer: Self-pay | Admitting: Family Medicine

## 2019-02-13 NOTE — Telephone Encounter (Signed)
Patient is requesting a refill on Hydrocodone & Lorazepam  LOV: 02/01/18  LRF:   01/17/19

## 2019-02-14 ENCOUNTER — Other Ambulatory Visit: Payer: Self-pay | Admitting: Family Medicine

## 2019-02-14 MED ORDER — LORAZEPAM 0.5 MG PO TABS: 0.5000 mg | ORAL_TABLET | Freq: Four times a day (QID) | ORAL | 0 refills | Status: DC | PRN

## 2019-02-14 MED ORDER — HYDROCODONE-ACETAMINOPHEN 5-325 MG PO TABS: 1.0000 | ORAL_TABLET | Freq: Four times a day (QID) | ORAL | 0 refills | Status: DC | PRN

## 2019-02-14 NOTE — Telephone Encounter (Signed)
Needs ov

## 2019-02-14 NOTE — Telephone Encounter (Signed)
Called and spoke to pt and aware of need of ov and will call back to schedule an apt in January 2021

## 2019-02-25 ENCOUNTER — Ambulatory Visit: Payer: Medicare Other | Admitting: Family Medicine

## 2019-03-02 ENCOUNTER — Other Ambulatory Visit: Payer: Self-pay | Admitting: Family Medicine

## 2019-03-06 ENCOUNTER — Other Ambulatory Visit: Payer: Self-pay

## 2019-03-06 ENCOUNTER — Ambulatory Visit (INDEPENDENT_AMBULATORY_CARE_PROVIDER_SITE_OTHER): Payer: Medicare Other | Admitting: Family Medicine

## 2019-03-06 ENCOUNTER — Encounter: Payer: Self-pay | Admitting: Family Medicine

## 2019-03-06 ENCOUNTER — Other Ambulatory Visit: Payer: Self-pay | Admitting: Family Medicine

## 2019-03-06 VITALS — BP 180/104 | HR 78 | Temp 97.2°F | Resp 12 | Ht 63.0 in | Wt 106.0 lb

## 2019-03-06 DIAGNOSIS — F411 Generalized anxiety disorder: Secondary | ICD-10-CM

## 2019-03-06 DIAGNOSIS — Z23 Encounter for immunization: Secondary | ICD-10-CM | POA: Diagnosis not present

## 2019-03-06 DIAGNOSIS — I10 Essential (primary) hypertension: Secondary | ICD-10-CM

## 2019-03-06 DIAGNOSIS — F321 Major depressive disorder, single episode, moderate: Secondary | ICD-10-CM | POA: Diagnosis not present

## 2019-03-06 DIAGNOSIS — G8929 Other chronic pain: Secondary | ICD-10-CM | POA: Diagnosis not present

## 2019-03-06 MED ORDER — LORAZEPAM 0.5 MG PO TABS: 0.5000 mg | ORAL_TABLET | Freq: Four times a day (QID) | ORAL | 0 refills | Status: DC | PRN

## 2019-03-06 MED ORDER — HYDROCODONE-ACETAMINOPHEN 5-325 MG PO TABS: 1.0000 | ORAL_TABLET | Freq: Four times a day (QID) | ORAL | 0 refills | Status: DC | PRN

## 2019-03-06 MED ORDER — LOSARTAN POTASSIUM 100 MG PO TABS: 100.0000 mg | ORAL_TABLET | Freq: Every day | ORAL | 3 refills | Status: AC

## 2019-03-06 NOTE — Progress Notes (Signed)
Subjective:    Patient ID: Rachel Kaufman, female    DOB: May 20, 1945, 74 y.o.   MRN: [id number]  Medication Refill  12/2013 Patient is a pleasant 74 year old white female who is requesting today a refill on her trazodone. She takes 200 mg at night to help her sleep. She also takes Ativan 1 mg twice a day as needed for anxiety. These medications are working well for her. Unfortunately she continues to have pain in her neck and in her right shoulder. She is seeing orthopedics was discovered that she has a chronically torn right rotator cuff. Surgery is not an option. She also has cervical radiculopathy stemming from an old fracture in the neck. The fracture has healed but she continues to have neck pain on the right side. At the present time she is able to manage the pain at night Flexeril. She is requesting a refill on the Flexeril. Occasionally she does take hydrocodone. For instance this month she is used 2- 4 pills of hydrocodone over 30 days. She uses the medication very sparingly. She would like a refill on this medication for as needed use. She is overdue for mammogram, Pap smear, bone density, and a colonoscopy. She refuses all these at the present time but she will consider them. Her labs were last checked in May and were relatively normal. She is complaining some of fatigue. She is overdue to check a thyroid test today. She is willing to get her flu shot as well as Pneumovax 23.  At that time, my plan was: I will refill the patient's Flexeril for her shoulder pain in her neck pain. I will also give her a prescription of 30 hydrocodone to be used sparingly for shoulder pain. I did refill her Ativan to be used for anxiety and her trazodone to be used for insomnia. I will check a TSH to evaluate her fatigue. Patient received a flu shot today as well as Pneumovax 23.  05/14/15 Patient is back today complaining of pain in her right shoulder. Pain is worse with abduction greater than 90. She has  significant pain with empty can testing. She has a negative Spurling sign. She has pain with internal and external rotation. She has a positive Hawkins sign. She did have a CT scan of the neck that showed moderate to severe degenerative disc disease at C5-C6 and C6-C7 with possible right-sided nerve impingement at both levels however the strength in her arm is normal, reflexes are normal, and she denies any neuropathic symptoms such as numbness or tingling. Therefore I believe the pain in her shoulder is due to her rotator cuff. The patient states she saw Dr. Metzger Blas in the past and she does have a chronic tear in her "rotator cuff". He recommended medical management rather than surgery. However she is been taking hydrocodone without relief. She is here today crying due to the pain. Her blood pressure is significantly elevated however she states that she is severely depressed. Please see my last office visit. At that time I discontinued Lexapro and switch the patient toTrintellix.  However she is seen very little benefit for this. This stems from the loss of her husband. She is still unable to cover from her sadness and mourning.  However she does not want to try any medicine that may cause weight gain.  At that time, my plan was: Patient does not want to treat her blood pressure today. She believes is due to pain as well as anxiety and sadness. Therefore I  will treat the pain is best I can. Discontinue hydrocodone and switch to Percocet 10/325 one by mouth every 6 hours when necessary pain. I gave her 81. This is temporary. Hopefully the cortisone injection I performed today and her shoulder will help ease the pain somewhat so she will not need this and we can resume her previous dose of hydrocodone. Using sterile technique, I injected the right shoulder with 2 mL of lidocaine, 2 mL of Marcaine, and 2 mL of 40 mg per mL Kenalog. The patient tolerated the procedure well without complication. I asked her to call the  back next week. I do not want to change too many medications at one time but if she is tolerating the new pain medication, I will switch the patient back to Lexapro and discontinue Trintellix.  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 She continues to have pain in her shoulder. I refused to continue to refill the Percocet. I recommended that she resume her previous dose of hydrocodone twice daily to avoid increasing dependence to stronger and stronger narcotics. However she states that her depression is doing much better on Trintellix and she now has no desire to switch.  At that time, my plan was: Continue Trintellix 10 mg poqday.  Add losartan 50 mg by mouth daily and recheck blood pressure in one month. Continue hydrocodone/acetaminophen 5/325 one by mouth twice a day 60 tablets per month for right shoulder pain. The pain worsens I will recommend a repeat for consultation versus a pain clinic  07/25/16 She is here today because I would not refill her medications until she was seen.  She continues to report severe depression.  She quit Trintellix and resumed lexapro.  However, she is only using lexapro 3 times a week because she wants to get off of it.  She uses ativan twice a day, sometimes three times a day.  The thoughts of her deceased husband will come to her leaving her in an uncontrolled crying spell without the medication.  She is also concerned about her sister who was diagnosed with colon cancer.  She uses hydrocodone once a day for chronic shoulder pain.  Will occasionally use 2 pills a day.  Has seen psychiatry in the past and tried and failed effexor as well.  She reports daily depression, anhedonia, insomnia, and poor energy.  She denies suicidal thoughts.  At that time, my plan was: Her blood pressure is high and she stopped  losartan.  She is adamant her blood pressure is great at home 110-130/60-70.  I am concerned by her continued dependence on frequent controlled substances.  I do not feel she is trying to abuse them.  However, I am worried this will eventually cause her problems such as falls or memory loss.  Therefore, I will wean her down on the medication.    Decrease hydrocodone from 120/62months to 90/78months.  Decrease ativan from 90 to 60 per month.  Only take ativan at night.  Try to use only half a pill in the daytime for "crying spells."  Focus on trying to prevent more effectively.  Discontinue lexapro and try cymbalta 60 mg poqday instead.  Reassess in 6 weeks.    02/22/17 Patient is here today at my request for follow-up. She states that she is doing much better thankfully on Cymbalta. The depression that  she was suffering from has improved dramatically since her last visit. Today she denies anhedonia. Her energy has improved. She finds joy in life again. On a positive note, her daughter is pregnant with her grandchild. She just found this out over Christmas. All of this is working to improve her depression. However she continues to suffer from insomnia. She takes 1-2 Ativan per day. She was taking 3. She is successfully weaned down to 1-2 per day which I'm very happy for. However she continues to complain of pain primarily in her right and left shoulders. She takes chronic pain medication for this reason. I have weaned her down now to 45 pain pills every 2 months. Patient is requesting a higher dose of this. I refused to increase this dose after successfully weaning her down to this point. Therefore I have recommended orthopedic surgery consultation for chronic right shoulder pain. She is overdue for her flu shot. She is due for an Prevnar 13. She is due for the shingles vaccine. She adamantly refuses all vaccines. However after a long discussion, the patient reluctantly agrees to receive Prevnar 13 today. She is  overdue for mammogram. She is also overdue for a colonoscopy. She continues to refuse these test. She states that she would rather not know if she has cancer. She states that she doesn't think she could cope with with the knowledge of cancer in her body. I spent more than 25 minutes today with the patient discussing the rationale behind screening for cancer. I explained that screening test her to find cancer in a state where is still treatable and that waiting until symptoms arise prevents [place] from being able to cure the cancer and would ultimately cause death. Despite a long discussion she continues to refuse a mammogram or colonoscopy or any type of cancer screening test.  02/01/18 Patient is here today at my request for follow-up.  She continues to require hormone replacement therapy.  We had a long discussion today about the increased risk of breast cancer, strokes, and DVT on hormone replacement therapy particular given her age.  She is currently taking Prempro 3 times a week.  I have encouraged her to try to wean down further if possible.  She still requires trazodone at night to help her sleep.  She is also taking combination of Wellbutrin and involved of her depression.  She has had a very difficult year.  After dealing with the loss of her husband, her sister was diagnosed and died from cancer.  This is exacerbated her anxiety and she has been using Ativan twice a day to help manage her anxiety and calm her nerves.  She denies any suicidal ideation.  Otherwise she states that the depression is relatively well trolled.  She still takes hydrocodone 1-2 times every day to help control the pain in her shoulder.  She also complains now of low back pain and hip pain. At that time, my plan was: I will continue to prescribe patient Ativan twice daily, 60 tablets/month as well as hydrocodone/acetaminophen twice daily, 60 tablets/month.  We had a long discussion today about her preventative care.  She is overdue  for mammogram as well as a colonoscopy she adamantly refuses this.  She states that even if she knew she had cancer and it was treatable she would not want to undergo surgery or take chemotherapy, etc.  Therefore she refuses any cancer screening.  She also adamantly declines a bone density test despite my best efforts to convince her  otherwise.  She will consent to allow me to check a CBC, CMP, and fasting lipid panel.  She wants to continue Cymbalta and Wellbutrin at the current dosages as the seem to help manage her depression.  Her insomnia is well controlled on trazodone.  I will not escalate the amount of her benzodiazepines or narcotics.  Her blood pressure today is elevated at 152/90 however she states that she checks this regularly at home on a daily basis and it typically is in the 120s over 70s.  03/06/19 Patient is here today at our request in order to get a refill of her pain medication.  She takes hydrocodone/acetaminophen twice daily.  She also takes Ativan twice daily for anxiety.  Overall she is doing better than she was last year however she continues to have issues with depression and anxiety.  She breaks into tears as she remembers her husband.  She lost her sister as well as her nephew earlier this year.  She is still on Prempro.  She is doing this still 3 times a week similar to last year.  She has not tried to wean down further.  We again discussed the increased risk of strokes, DVT, and breast cancer on hormone replacement particularly at advanced ages.  She is willing to try to wean down further but she still request that I refill her hormone replacement therapy.  Blood pressure is again elevated today similar to last year.  She continues to say that this is a spurious finding however she is not truly checking her blood pressure at home and therefore I am concerned that this is real.  I explained to the patient that this would increase her risk of stroke further coupled with the hormone  replacement therapy.  She denies any chest pain shortness of breath or dyspnea on exertion.  She denies any TIA-like symptoms.  Past Medical History:  Diagnosis Date  . Depression   . Insomnia   . Postmenopausal HRT (hormone replacement therapy)    Past Surgical History:  Procedure Laterality Date  . BACK SURGERY     Current Outpatient Medications on File Prior to Visit  Medication Sig Dispense Refill  . buPROPion (WELLBUTRIN XL) 300 MG 24 hr tablet Take 1 tablet (300 mg total) by mouth every morning. (Needs office visit before further refills) 90 tablet 0  . DULoxetine (CYMBALTA) 60 MG capsule TAKE 1 CAPSULE(60 MG) BY MOUTH DAILY 90 capsule 3  . fish oil-omega-3 fatty acids 1000 MG capsule Take 2 g by mouth daily.    Marland Kitchen. HYDROcodone-acetaminophen (NORCO/VICODIN) 5-325 MG tablet Take 1 tablet by mouth every 6 (six) hours as needed for moderate pain (DX: G89.4). 60 tablet 0  . LORazepam (ATIVAN) 0.5 MG tablet Take 1 tablet (0.5 mg total) by mouth every 6 (six) hours as needed for anxiety. 60 tablet 0  . PREMPRO 0.45-1.5 MG tablet TAKE 1 TABLET BY MOUTH DAILY 28 tablet 3  . traZODone (DESYREL) 100 MG tablet TAKE 2 TABLETS(200 MG) BY MOUTH AT BEDTIME 60 tablet 2  . TRIAMCINOLONE ACETONIDE, TOP, 0.05 % OINT Apply 1 application topically 2 (two) times daily. 30 g 0  . UNABLE TO FIND Testosterone 8% mix with cetaphil cream - Apply prn - could not find this medication in our computer - her rx # is [id number] and she can have it filled just once and at last gram as well not sure how much she gets at a time. thanks 30 g 0  . vitamin B-12 (  CYANOCOBALAMIN) 1000 MCG tablet Take 1,000 mcg by mouth daily.    . vitamin C (ASCORBIC ACID) 500 MG tablet Take 500 mg by mouth daily.     No current facility-administered medications on file prior to visit.   Allergies  Allergen Reactions  . Codeine Nausea And Vomiting   Social History   Socioeconomic History  . Marital status: Single    Spouse name: Not on  file  . Number of children: Not on file  . Years of education: Not on file  . Highest education level: Not on file  Occupational History  . Not on file  Tobacco Use  . Smoking status: Never Smoker  . Smokeless tobacco: Never Used  Substance and Sexual Activity  . Alcohol use: Yes  . Drug use: Yes    Types: Marijuana  . Sexual activity: Yes    Birth control/protection: None  Other Topics Concern  . Not on file  Social History Narrative  . Not on file   Social Determinants of Health   Financial Resource Strain:   . Difficulty of Paying Living Expenses: Not on file  Food Insecurity:   . Worried About Charity fundraiser in the Last Year: Not on file  . Ran Out of Food in the Last Year: Not on file  Transportation Needs:   . Lack of Transportation (Medical): Not on file  . Lack of Transportation (Non-Medical): Not on file  Physical Activity:   . Days of Exercise per Week: Not on file  . Minutes of Exercise per Session: Not on file  Stress:   . Feeling of Stress : Not on file  Social Connections:   . Frequency of Communication with Friends and Family: Not on file  . Frequency of Social Gatherings with Friends and Family: Not on file  . Attends Religious Services: Not on file  . Active Member of Clubs or Organizations: Not on file  . Attends Archivist Meetings: Not on file  . Marital Status: Not on file  Intimate Partner Violence:   . Fear of Current or Ex-Partner: Not on file  . Emotionally Abused: Not on file  . Physically Abused: Not on file  . Sexually Abused: Not on file      Review of Systems  All other systems reviewed and are negative.      Objective:   Physical Exam  Neck: No thyromegaly present.  Cardiovascular: Normal rate, regular rhythm and normal heart sounds.  Pulmonary/Chest: Effort normal and breath sounds normal. No respiratory distress. She has no wheezes. She has no rales.  Abdominal: Soft. Bowel sounds are normal. She exhibits no  distension. There is no abdominal tenderness. There is no rebound and no guarding.  Musculoskeletal:        General: No edema.     Right shoulder: Tenderness and pain present. No crepitus. Decreased range of motion. Decreased strength.  Skin: No rash noted.  Vitals reviewed.         Assessment & Plan:  Current moderate episode of major depressive disorder without prior episode (HCC)  GAD (generalized anxiety disorder)  Other chronic pain  Benign essential HTN - Plan: CBC with Differential, COMPLETE METABOLIC PANEL WITH GFR, Lipid Panel  Needs flu shot - Plan: FLUAD - Flu Vaccine QUAD High Dose(Fluad)  I will again refill her hydrocodone which she takes 2 a day/60/month.  I will also refill her alprazolam that she takes twice a day/60/month.  I am more concerned about her blood  pressure.  Begin losartan 100 mg a day and recheck blood pressure in 1 month along with a BMP to monitor renal function and potassium.  Meanwhile today obtain baseline labs including CBC, CMP, fasting lipid panel.  I was also able to convince the patient to receive her flu shot.  She adamantly refuses a bone density, mammogram, colonoscopy, or Pap smear.

## 2019-03-07 ENCOUNTER — Encounter: Payer: Self-pay | Admitting: Family Medicine

## 2019-03-07 LAB — CBC WITH DIFFERENTIAL/PLATELET
Absolute Monocytes: 377 cells/uL (ref 200–950)
Basophils Absolute: 28 cells/uL (ref 0–200)
Basophils Relative: 0.6 %
Eosinophils Absolute: 110 cells/uL (ref 15–500)
Eosinophils Relative: 2.4 %
HCT: 36 % (ref 35.0–45.0)
Hemoglobin: 12 g/dL (ref 11.7–15.5)
Lymphs Abs: 1132 cells/uL (ref 850–3900)
MCH: 32.6 pg (ref 27.0–33.0)
MCHC: 33.3 g/dL (ref 32.0–36.0)
MCV: 97.8 fL (ref 80.0–100.0)
MPV: 11.2 fL (ref 7.5–12.5)
Monocytes Relative: 8.2 %
Neutro Abs: 2953 cells/uL (ref 1500–7800)
Neutrophils Relative %: 64.2 %
Platelets: 204 10*3/uL (ref 140–400)
RBC: 3.68 10*6/uL — ABNORMAL LOW (ref 3.80–5.10)
RDW: 12.4 % (ref 11.0–15.0)
Total Lymphocyte: 24.6 %
WBC: 4.6 10*3/uL (ref 3.8–10.8)

## 2019-03-07 LAB — COMPLETE METABOLIC PANEL WITH GFR
AG Ratio: 1.9 (calc) (ref 1.0–2.5)
ALT: 12 U/L (ref 6–29)
AST: 16 U/L (ref 10–35)
Albumin: 4 g/dL (ref 3.6–5.1)
Alkaline phosphatase (APISO): 48 U/L (ref 37–153)
BUN/Creatinine Ratio: 39 (calc) — ABNORMAL HIGH (ref 6–22)
BUN: 32 mg/dL — ABNORMAL HIGH (ref 7–25)
CO2: 26 mmol/L (ref 20–32)
Calcium: 9 mg/dL (ref 8.6–10.4)
Chloride: 106 mmol/L (ref 98–110)
Creat: 0.82 mg/dL (ref 0.60–0.93)
GFR, Est African American: 83 mL/min/{1.73_m2} (ref >=60)
GFR, Est Non African American: 71 mL/min/{1.73_m2} (ref >=60)
Globulin: 2.1 g/dL (calc) (ref 1.9–3.7)
Glucose, Bld: 89 mg/dL (ref 65–99)
Potassium: 4.2 mmol/L (ref 3.5–5.3)
Sodium: 139 mmol/L (ref 135–146)
Total Bilirubin: 0.3 mg/dL (ref 0.2–1.2)
Total Protein: 6.1 g/dL (ref 6.1–8.1)

## 2019-03-07 LAB — LIPID PANEL
Cholesterol: 201 mg/dL — ABNORMAL HIGH (ref <200)
HDL: 87 mg/dL (ref >=50)
LDL Cholesterol (Calc): 100 mg/dL (calc) — ABNORMAL HIGH
Non-HDL Cholesterol (Calc): 114 mg/dL (calc) (ref <130)
Total CHOL/HDL Ratio: 2.3 (calc) (ref <5.0)
Triglycerides: 57 mg/dL (ref <150)

## 2019-04-03 ENCOUNTER — Other Ambulatory Visit: Payer: Self-pay | Admitting: Family Medicine

## 2019-04-03 NOTE — Telephone Encounter (Signed)
Patient is requesting a refill on Hydrocodone  & Lorazepam  LOV: 03/06/19  LRF:   03/06/19

## 2019-04-04 ENCOUNTER — Other Ambulatory Visit: Payer: Self-pay | Admitting: Family Medicine

## 2019-04-04 MED ORDER — HYDROCODONE-ACETAMINOPHEN 5-325 MG PO TABS: 1.0000 | ORAL_TABLET | Freq: Four times a day (QID) | ORAL | 0 refills | Status: DC | PRN

## 2019-04-04 MED ORDER — LORAZEPAM 0.5 MG PO TABS: 0.5000 mg | ORAL_TABLET | Freq: Four times a day (QID) | ORAL | 0 refills | Status: DC | PRN

## 2019-04-25 ENCOUNTER — Other Ambulatory Visit: Payer: Self-pay | Admitting: Family Medicine

## 2019-04-25 MED ORDER — LORAZEPAM 0.5 MG PO TABS: 0.5000 mg | ORAL_TABLET | Freq: Four times a day (QID) | ORAL | 0 refills | Status: DC | PRN

## 2019-04-25 MED ORDER — HYDROCODONE-ACETAMINOPHEN 5-325 MG PO TABS: 1.0000 | ORAL_TABLET | Freq: Four times a day (QID) | ORAL | 0 refills | Status: DC | PRN

## 2019-04-25 NOTE — Telephone Encounter (Signed)
Patient is requesting a refill on Hydrocodone & Lorazepam  LOV: 03/06/19  LRF:  04/04/19

## 2019-04-27 ENCOUNTER — Other Ambulatory Visit: Payer: Self-pay | Admitting: Family Medicine

## 2019-04-30 ENCOUNTER — Other Ambulatory Visit: Payer: Self-pay | Admitting: Family Medicine

## 2019-05-20 ENCOUNTER — Other Ambulatory Visit: Payer: Self-pay | Admitting: Family Medicine

## 2019-05-20 MED ORDER — HYDROCODONE-ACETAMINOPHEN 5-325 MG PO TABS: 1.0000 | ORAL_TABLET | Freq: Four times a day (QID) | ORAL | 0 refills | Status: DC | PRN

## 2019-05-20 MED ORDER — LORAZEPAM 0.5 MG PO TABS: 0.5000 mg | ORAL_TABLET | Freq: Four times a day (QID) | ORAL | 0 refills | Status: DC | PRN

## 2019-05-20 NOTE — Telephone Encounter (Signed)
Patient is requesting a refill on Hydrocodone & Lorazepam  (I put start date as 05/23/2019)   LOV: 03/06/2019  LRF:  04/25/2019

## 2019-05-22 ENCOUNTER — Telehealth: Payer: Self-pay | Admitting: *Deleted

## 2019-05-22 NOTE — Telephone Encounter (Signed)
Received VM from patient.   Reports that she was unable to fill her Hydrocodone/APAP. States that the pharmacy will not fill until 05/23/2019 for a 15 day supply.   Noted prescription was sent to pharmacy on 05/20/2019. Quantity remains #60 tabs. This has been the same since 03/27/2017.

## 2019-05-22 NOTE — Telephone Encounter (Signed)
Called and LMOVM stating that she gets 60 tablets of hydrocodone a month and while yes it is 15 day supply she know that it is to last her a month. We gave her the correct qty and it is not due until 05/23/19 as her last refill was 04/25/19 and with February being a short month she is actually early for the refill on it. She may call back if she has any questions.

## 2019-05-30 ENCOUNTER — Other Ambulatory Visit: Payer: Self-pay | Admitting: Family Medicine

## 2019-06-20 ENCOUNTER — Other Ambulatory Visit: Payer: Self-pay | Admitting: Family Medicine

## 2019-06-20 NOTE — Telephone Encounter (Signed)
Patient is requesting a refill on Hydrocodone  & lorazepam  LOV: 05/22/2019  LRF:   05/20/2019

## 2019-06-23 MED ORDER — HYDROCODONE-ACETAMINOPHEN 5-325 MG PO TABS: 1.0000 | ORAL_TABLET | Freq: Four times a day (QID) | ORAL | 0 refills | Status: DC | PRN

## 2019-06-23 MED ORDER — LORAZEPAM 0.5 MG PO TABS: 0.5000 mg | ORAL_TABLET | Freq: Four times a day (QID) | ORAL | 0 refills | Status: DC | PRN

## 2019-06-30 DIAGNOSIS — H04123 Dry eye syndrome of bilateral lacrimal glands: Secondary | ICD-10-CM | POA: Diagnosis not present

## 2019-06-30 DIAGNOSIS — H2513 Age-related nuclear cataract, bilateral: Secondary | ICD-10-CM | POA: Diagnosis not present

## 2019-06-30 DIAGNOSIS — H43812 Vitreous degeneration, left eye: Secondary | ICD-10-CM | POA: Diagnosis not present

## 2019-06-30 DIAGNOSIS — D3132 Benign neoplasm of left choroid: Secondary | ICD-10-CM | POA: Diagnosis not present

## 2019-07-19 ENCOUNTER — Other Ambulatory Visit: Payer: Self-pay | Admitting: Family Medicine

## 2019-07-21 ENCOUNTER — Other Ambulatory Visit: Payer: Self-pay | Admitting: Family Medicine

## 2019-07-21 MED ORDER — LORAZEPAM 0.5 MG PO TABS: 0.5000 mg | ORAL_TABLET | Freq: Four times a day (QID) | ORAL | 0 refills | Status: DC | PRN

## 2019-07-21 MED ORDER — HYDROCODONE-ACETAMINOPHEN 5-325 MG PO TABS: 1.0000 | ORAL_TABLET | Freq: Four times a day (QID) | ORAL | 0 refills | Status: DC | PRN

## 2019-07-21 NOTE — Progress Notes (Signed)
Last refilled: 06/23/2019 Last office visit: 03/06/2019

## 2019-08-18 ENCOUNTER — Other Ambulatory Visit: Payer: Self-pay

## 2019-08-19 ENCOUNTER — Other Ambulatory Visit: Payer: Self-pay

## 2019-08-19 MED ORDER — HYDROCODONE-ACETAMINOPHEN 5-325 MG PO TABS: 1.0000 | ORAL_TABLET | Freq: Four times a day (QID) | ORAL | 0 refills | Status: DC | PRN

## 2019-08-19 NOTE — Telephone Encounter (Signed)
Last refill 07/21/19 

## 2019-08-20 ENCOUNTER — Other Ambulatory Visit: Payer: Self-pay | Admitting: Family Medicine

## 2019-08-20 NOTE — Telephone Encounter (Signed)
Ok to refill??  Last office visit 03/06/2019.  Last refill 07/21/2019.

## 2019-08-20 NOTE — Telephone Encounter (Signed)
Last refilled 07/21/19

## 2019-08-21 MED ORDER — LORAZEPAM 0.5 MG PO TABS: 0.5000 mg | ORAL_TABLET | Freq: Four times a day (QID) | ORAL | 0 refills | Status: DC | PRN

## 2019-09-16 ENCOUNTER — Other Ambulatory Visit: Payer: Self-pay | Admitting: *Deleted

## 2019-09-16 MED ORDER — HYDROCODONE-ACETAMINOPHEN 5-325 MG PO TABS: 1.0000 | ORAL_TABLET | Freq: Four times a day (QID) | ORAL | 0 refills | Status: DC | PRN

## 2019-09-16 MED ORDER — LORAZEPAM 0.5 MG PO TABS: 0.5000 mg | ORAL_TABLET | Freq: Four times a day (QID) | ORAL | 0 refills | Status: DC | PRN

## 2019-09-16 NOTE — Telephone Encounter (Signed)
Received call from patient.   Requested refill on Ativan and Hydrocodone/APAP.   Ok to refill??  Last office visit 07/21/2019.  Last refill on Ativan 08/21/2019.  Last refill on Hydrocodone/APAP 08/19/2019.

## 2019-10-11 ENCOUNTER — Other Ambulatory Visit: Payer: Self-pay | Admitting: Family Medicine

## 2019-10-16 ENCOUNTER — Other Ambulatory Visit: Payer: Self-pay

## 2019-10-16 MED ORDER — HYDROCODONE-ACETAMINOPHEN 5-325 MG PO TABS: 1.0000 | ORAL_TABLET | Freq: Four times a day (QID) | ORAL | 0 refills | Status: DC | PRN

## 2019-10-16 MED ORDER — LORAZEPAM 0.5 MG PO TABS: 0.5000 mg | ORAL_TABLET | Freq: Four times a day (QID) | ORAL | 0 refills | Status: DC | PRN

## 2019-11-14 ENCOUNTER — Other Ambulatory Visit: Payer: Self-pay

## 2019-11-14 MED ORDER — HYDROCODONE-ACETAMINOPHEN 5-325 MG PO TABS: 1.0000 | ORAL_TABLET | Freq: Four times a day (QID) | ORAL | 0 refills | Status: DC | PRN

## 2019-11-14 MED ORDER — LORAZEPAM 0.5 MG PO TABS: 0.5000 mg | ORAL_TABLET | Freq: Four times a day (QID) | ORAL | 0 refills | Status: DC | PRN

## 2019-11-14 NOTE — Telephone Encounter (Signed)
Requested Prescriptions   Pending Prescriptions Disp Refills  . HYDROcodone-acetaminophen (NORCO/VICODIN) 5-325 MG tablet 60 tablet 0    Sig: Take 1 tablet by mouth every 6 (six) hours as needed for moderate pain (DX: G89.4).  . LORazepam (ATIVAN) 0.5 MG tablet 60 tablet 0    Sig: Take 1 tablet (0.5 mg total) by mouth every 6 (six) hours as needed for anxiety.     Last OV 03/06/2019    Last written 10/16/2019

## 2019-12-15 ENCOUNTER — Other Ambulatory Visit: Payer: Self-pay | Admitting: Family Medicine

## 2019-12-15 NOTE — Telephone Encounter (Signed)
Refill Hydrocodone, Ativan

## 2019-12-16 ENCOUNTER — Telehealth: Payer: Self-pay

## 2019-12-16 MED ORDER — HYDROCODONE-ACETAMINOPHEN 5-325 MG PO TABS: 1.0000 | ORAL_TABLET | Freq: Four times a day (QID) | ORAL | 0 refills | Status: DC | PRN

## 2019-12-16 MED ORDER — LORAZEPAM 0.5 MG PO TABS: 0.5000 mg | ORAL_TABLET | Freq: Four times a day (QID) | ORAL | 0 refills | Status: DC | PRN

## 2019-12-16 NOTE — Telephone Encounter (Signed)
Ok to refill??  Last office visit 03/06/2019.  Last refill 11/14/2019 on both.

## 2019-12-16 NOTE — Telephone Encounter (Signed)
Pt is requesting a refill on Hydrocodone/Lorazapam.  Please Advise.

## 2020-01-03 ENCOUNTER — Other Ambulatory Visit: Payer: Self-pay | Admitting: Family Medicine

## 2020-01-05 ENCOUNTER — Other Ambulatory Visit: Payer: Self-pay | Admitting: Family Medicine

## 2020-01-05 MED ORDER — HYDROCODONE-ACETAMINOPHEN 5-325 MG PO TABS: 1.0000 | ORAL_TABLET | Freq: Four times a day (QID) | ORAL | 0 refills | Status: DC | PRN

## 2020-01-05 MED ORDER — LORAZEPAM 0.5 MG PO TABS: 0.5000 mg | ORAL_TABLET | Freq: Four times a day (QID) | ORAL | 0 refills | Status: DC | PRN

## 2020-01-05 NOTE — Telephone Encounter (Signed)
Okay to refill? Pt has not been seen since Jan 2021. I have attempted to call pt to get her scheduled, but there was no answer so I left a message to call back to get her appointment.   Patient is requesting a refill of the following medications: Requested Prescriptions   Pending Prescriptions Disp Refills   traZODone (DESYREL) 100 MG tablet [Pharmacy Med Name: TRAZODONE 100MG  TABLETS] 60 tablet 2    Sig: TAKE 2 TABLETS(200 MG) BY MOUTH AT BEDTIME

## 2020-02-05 ENCOUNTER — Other Ambulatory Visit: Payer: Self-pay

## 2020-02-05 MED ORDER — LORAZEPAM 0.5 MG PO TABS: 0.5000 mg | ORAL_TABLET | Freq: Four times a day (QID) | ORAL | 0 refills | Status: DC | PRN

## 2020-02-05 MED ORDER — HYDROCODONE-ACETAMINOPHEN 5-325 MG PO TABS: 1.0000 | ORAL_TABLET | Freq: Four times a day (QID) | ORAL | 0 refills | Status: DC | PRN

## 2020-02-05 NOTE — Telephone Encounter (Signed)
Rachel Kaufman called for rx refill

## 2020-03-05 ENCOUNTER — Other Ambulatory Visit: Payer: Self-pay | Admitting: *Deleted

## 2020-03-05 MED ORDER — LORAZEPAM 0.5 MG PO TABS: 0.5000 mg | ORAL_TABLET | Freq: Four times a day (QID) | ORAL | 0 refills | Status: DC | PRN

## 2020-03-05 MED ORDER — HYDROCODONE-ACETAMINOPHEN 5-325 MG PO TABS: 1.0000 | ORAL_TABLET | Freq: Four times a day (QID) | ORAL | 0 refills | Status: DC | PRN

## 2020-03-05 NOTE — Telephone Encounter (Signed)
Received VM from patient.   Requested refill on Ativan and Hydrocodone/APAP.  Ok to refill??  Last office visit 03/06/2019.   Last refill 02/05/2020 on both.

## 2020-03-27 ENCOUNTER — Other Ambulatory Visit: Payer: Self-pay | Admitting: Family Medicine

## 2020-04-07 ENCOUNTER — Other Ambulatory Visit: Payer: Self-pay | Admitting: *Deleted

## 2020-04-07 NOTE — Telephone Encounter (Signed)
Received call from patient.   Requested refill on Hydrocodone/APAP and Lorazepam.   Ok to refill??  Last office visit 01/05/2020.  Last refill 03/05/2020 on both.

## 2020-04-08 MED ORDER — HYDROCODONE-ACETAMINOPHEN 5-325 MG PO TABS: 1.0000 | ORAL_TABLET | Freq: Four times a day (QID) | ORAL | 0 refills | Status: DC | PRN

## 2020-04-08 MED ORDER — LORAZEPAM 0.5 MG PO TABS: 0.5000 mg | ORAL_TABLET | Freq: Four times a day (QID) | ORAL | 0 refills | Status: DC | PRN

## 2020-04-26 ENCOUNTER — Other Ambulatory Visit: Payer: Self-pay | Admitting: Family Medicine

## 2020-05-11 ENCOUNTER — Other Ambulatory Visit: Payer: Self-pay | Admitting: *Deleted

## 2020-05-11 MED ORDER — LORAZEPAM 0.5 MG PO TABS: 0.5000 mg | ORAL_TABLET | Freq: Four times a day (QID) | ORAL | 0 refills | Status: DC | PRN

## 2020-05-11 MED ORDER — HYDROCODONE-ACETAMINOPHEN 5-325 MG PO TABS: 1.0000 | ORAL_TABLET | Freq: Four times a day (QID) | ORAL | 0 refills | Status: DC | PRN

## 2020-05-11 NOTE — Telephone Encounter (Signed)
Received call from patient.   Requested refill on Ativan and Hydrocodone/APAP.   Ok to refill??  Last office visit 03/06/2019.  Last refill 02/120/2022 on both.   Of note, letter sent to patient to schedule F/U.

## 2020-05-26 ENCOUNTER — Other Ambulatory Visit: Payer: Self-pay | Admitting: Family Medicine

## 2020-06-10 ENCOUNTER — Ambulatory Visit (INDEPENDENT_AMBULATORY_CARE_PROVIDER_SITE_OTHER): Payer: Medicare Other | Admitting: Family Medicine

## 2020-06-10 ENCOUNTER — Other Ambulatory Visit: Payer: Self-pay

## 2020-06-10 ENCOUNTER — Encounter: Payer: Self-pay | Admitting: Family Medicine

## 2020-06-10 VITALS — BP 144/82 | HR 72 | Temp 99.2°F | Resp 16 | Ht 63.0 in | Wt 101.0 lb

## 2020-06-10 DIAGNOSIS — I10 Essential (primary) hypertension: Secondary | ICD-10-CM

## 2020-06-10 DIAGNOSIS — M25551 Pain in right hip: Secondary | ICD-10-CM

## 2020-06-10 MED ORDER — LORAZEPAM 0.5 MG PO TABS: 0.5000 mg | ORAL_TABLET | Freq: Four times a day (QID) | ORAL | 0 refills | Status: DC | PRN

## 2020-06-10 MED ORDER — HYDROCODONE-ACETAMINOPHEN 5-325 MG PO TABS: 1.0000 | ORAL_TABLET | Freq: Four times a day (QID) | ORAL | 0 refills | Status: DC | PRN

## 2020-06-10 NOTE — Progress Notes (Signed)
Subjective:    Patient ID: Rachel Kaufman, female    DOB: 01-19-1946, 75 y.o.   MRN: [id number]  Medication Refill  Patient presents today only to get a refill on her pain medication and her Xanax.  However she does complain of right hip pain.  She states that over the last 2 months she has had worsening pain in her anterior right hip.  Flexion of the right hip is limited to 45 degrees!.  She has severe pain with only 10 degrees of internal rotation.  External rotation is normal.  Range of motion in the left hip is normal.  She denies any posterior hip pain.  She continues to complain of severe pain in her lower back.  She is using the pain medication for pain in her shoulder and pain in her lower back on a regular basis.  She denies any memory loss.  She is only fallen once in the last year and that is when she tripped at her couch but she believes that that is when she injured her right hip.  Her depression is controlled at the present time Past Medical History:  Diagnosis Date  . Depression   . Insomnia   . Postmenopausal HRT (hormone replacement therapy)    Past Surgical History:  Procedure Laterality Date  . BACK SURGERY     Current Outpatient Medications on File Prior to Visit  Medication Sig Dispense Refill  . Biotin 1000 MCG tablet Take 1,000 mcg by mouth 3 (three) times daily.    Marland Kitchen buPROPion (WELLBUTRIN XL) 300 MG 24 hr tablet TAKE 1 TABLET(300 MG) BY MOUTH EVERY MORNING 90 tablet 3  . cholecalciferol (VITAMIN D3) 25 MCG (1000 UNIT) tablet Take 1,000 Units by mouth daily.    . DULoxetine (CYMBALTA) 60 MG capsule TAKE 1 CAPSULE(60 MG) BY MOUTH DAILY 90 capsule 3  . fish oil-omega-3 fatty acids 1000 MG capsule Take 2 g by mouth daily.    Marland Kitchen losartan (COZAAR) 100 MG tablet Take 1 tablet (100 mg total) by mouth daily. 90 tablet 3  . naproxen sodium (ALEVE) 220 MG tablet Take 220 mg by mouth.    . traZODone (DESYREL) 100 MG tablet TAKE 2 TABLETS(200 MG) BY MOUTH AT BEDTIME 60 tablet 2  .  vitamin B-12 (CYANOCOBALAMIN) 1000 MCG tablet Take 1,000 mcg by mouth daily.    . vitamin C (ASCORBIC ACID) 500 MG tablet Take 500 mg by mouth daily.     No current facility-administered medications on file prior to visit.   Allergies  Allergen Reactions  . Codeine Nausea And Vomiting   Social History   Socioeconomic History  . Marital status: Single    Spouse name: Not on file  . Number of children: Not on file  . Years of education: Not on file  . Highest education level: Not on file  Occupational History  . Not on file  Tobacco Use  . Smoking status: Never Smoker  . Smokeless tobacco: Never Used  Substance and Sexual Activity  . Alcohol use: Yes  . Drug use: Yes    Types: Marijuana  . Sexual activity: Yes    Birth control/protection: None  Other Topics Concern  . Not on file  Social History Narrative   ** Merged History Encounter **       Social Determinants of Health   Financial Resource Strain: Not on file  Food Insecurity: Not on file  Transportation Needs: Not on file  Physical Activity: Not on file  Stress: Not on file  Social Connections: Not on file  Intimate Partner Violence: Not on file      Review of Systems  All other systems reviewed and are negative.      Objective:   Physical Exam Vitals reviewed.  Neck:     Thyroid: No thyromegaly.  Cardiovascular:     Rate and Rhythm: Normal rate and regular rhythm.     Heart sounds: Normal heart sounds.  Pulmonary:     Effort: Pulmonary effort is normal. No respiratory distress.     Breath sounds: Normal breath sounds. No wheezing or rales.  Abdominal:     General: Bowel sounds are normal. There is no distension.     Palpations: Abdomen is soft.     Tenderness: There is no abdominal tenderness. There is no guarding or rebound.  Musculoskeletal:     Right hip: Tenderness and bony tenderness present. Decreased range of motion. Decreased strength.     Left hip: Normal.  Skin:    Findings: No  rash.           Assessment & Plan:  Right hip pain - Plan: DG Hip Unilat W OR W/O Pelvis 2-3 Views Right  Benign essential HTN - Plan: CBC with Differential/Platelet, COMPLETE METABOLIC PANEL WITH GFR  I will again refill her hydrocodone which she takes 2 a day/60/month.  I will also refill her alprazolam that she takes twice a day/60/month.  Her blood pressure today is acceptable.  I am concerned about her right hip pain.  Obtain an x-ray of the right hip to evaluate further.  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  Meanwhile check CBC and CMP.  She continues to decline any other preventative medicine/care.

## 2020-06-11 LAB — COMPLETE METABOLIC PANEL WITH GFR
AG Ratio: 1.6 (calc) (ref 1.0–2.5)
ALT: 8 U/L (ref 6–29)
AST: 17 U/L (ref 10–35)
Albumin: 3.9 g/dL (ref 3.6–5.1)
Alkaline phosphatase (APISO): 61 U/L (ref 37–153)
BUN/Creatinine Ratio: 31 (calc) — ABNORMAL HIGH (ref 6–22)
BUN: 27 mg/dL — ABNORMAL HIGH (ref 7–25)
CO2: 24 mmol/L (ref 20–32)
Calcium: 9.3 mg/dL (ref 8.6–10.4)
Chloride: 108 mmol/L (ref 98–110)
Creat: 0.87 mg/dL (ref 0.60–0.93)
GFR, Est African American: 77 mL/min/{1.73_m2} (ref >=60)
GFR, Est Non African American: 66 mL/min/{1.73_m2} (ref >=60)
Globulin: 2.5 g/dL (calc) (ref 1.9–3.7)
Glucose, Bld: 138 mg/dL — ABNORMAL HIGH (ref 65–99)
Potassium: 4.3 mmol/L (ref 3.5–5.3)
Sodium: 140 mmol/L (ref 135–146)
Total Bilirubin: 0.3 mg/dL (ref 0.2–1.2)
Total Protein: 6.4 g/dL (ref 6.1–8.1)

## 2020-06-11 LAB — CBC WITH DIFFERENTIAL/PLATELET
Absolute Monocytes: 394 cells/uL (ref 200–950)
Basophils Absolute: 29 cells/uL (ref 0–200)
Basophils Relative: 0.6 %
Eosinophils Absolute: 120 cells/uL (ref 15–500)
Eosinophils Relative: 2.5 %
HCT: 35.4 % (ref 35.0–45.0)
Hemoglobin: 11.2 g/dL — ABNORMAL LOW (ref 11.7–15.5)
Lymphs Abs: 1176 cells/uL (ref 850–3900)
MCH: 29.2 pg (ref 27.0–33.0)
MCHC: 31.6 g/dL — ABNORMAL LOW (ref 32.0–36.0)
MCV: 92.2 fL (ref 80.0–100.0)
MPV: 11.3 fL (ref 7.5–12.5)
Monocytes Relative: 8.2 %
Neutro Abs: 3082 cells/uL (ref 1500–7800)
Neutrophils Relative %: 64.2 %
Platelets: 250 10*3/uL (ref 140–400)
RBC: 3.84 10*6/uL (ref 3.80–5.10)
RDW: 12.9 % (ref 11.0–15.0)
Total Lymphocyte: 24.5 %
WBC: 4.8 10*3/uL (ref 3.8–10.8)

## 2020-06-14 ENCOUNTER — Encounter: Payer: Self-pay | Admitting: *Deleted

## 2020-06-21 ENCOUNTER — Other Ambulatory Visit: Payer: Self-pay | Admitting: *Deleted

## 2020-06-21 ENCOUNTER — Telehealth: Payer: Self-pay | Admitting: *Deleted

## 2020-06-21 DIAGNOSIS — M545 Low back pain, unspecified: Secondary | ICD-10-CM

## 2020-06-21 NOTE — Telephone Encounter (Signed)
Received call from patient.   Reports that she continues to have R hip pain, but she also states that she has pain in lower back. Requested order for X-ray.  Orders placed.   Call placed to patient and patient made aware.

## 2020-06-22 ENCOUNTER — Ambulatory Visit
Admission: RE | Admit: 2020-06-22 | Discharge: 2020-06-22 | Disposition: A | Discharge status: Home / Self Care | Payer: Medicare Other | Source: Ambulatory Visit | Attending: Family Medicine | Admitting: Family Medicine

## 2020-06-22 DIAGNOSIS — M25551 Pain in right hip: Secondary | ICD-10-CM

## 2020-06-22 DIAGNOSIS — M545 Low back pain, unspecified: Secondary | ICD-10-CM | POA: Diagnosis not present

## 2020-06-22 DIAGNOSIS — M1611 Unilateral primary osteoarthritis, right hip: Secondary | ICD-10-CM | POA: Diagnosis not present

## 2020-06-22 DIAGNOSIS — M47816 Spondylosis without myelopathy or radiculopathy, lumbar region: Secondary | ICD-10-CM | POA: Diagnosis not present

## 2020-06-22 DIAGNOSIS — M533 Sacrococcygeal disorders, not elsewhere classified: Secondary | ICD-10-CM | POA: Diagnosis not present

## 2020-06-22 DIAGNOSIS — M5386 Other specified dorsopathies, lumbar region: Secondary | ICD-10-CM | POA: Diagnosis not present

## 2020-06-24 ENCOUNTER — Other Ambulatory Visit: Payer: Self-pay | Admitting: *Deleted

## 2020-06-24 DIAGNOSIS — M25551 Pain in right hip: Secondary | ICD-10-CM

## 2020-06-24 DIAGNOSIS — M545 Low back pain, unspecified: Secondary | ICD-10-CM

## 2020-06-25 ENCOUNTER — Other Ambulatory Visit: Payer: Self-pay | Admitting: Family Medicine

## 2020-07-12 ENCOUNTER — Other Ambulatory Visit: Payer: Self-pay | Admitting: Family Medicine

## 2020-07-12 MED ORDER — LORAZEPAM 0.5 MG PO TABS: 0.5000 mg | ORAL_TABLET | Freq: Four times a day (QID) | ORAL | 0 refills | Status: DC | PRN

## 2020-07-12 MED ORDER — HYDROCODONE-ACETAMINOPHEN 5-325 MG PO TABS: 1.0000 | ORAL_TABLET | Freq: Four times a day (QID) | ORAL | 0 refills | Status: DC | PRN

## 2020-07-12 NOTE — Telephone Encounter (Signed)
Ok to refill??  Last office visit/  refill 06/10/2020 on both.

## 2020-07-12 NOTE — Telephone Encounter (Signed)
Pt called stating she need refills of  HYDROcodone-acetaminophen (NORCO/VICODIN) 5-325  LORazepam (ATIVAN) 0.5 MG tablet sent to pharmacy please call.  Cb#: [phone]

## 2020-07-20 DIAGNOSIS — M25551 Pain in right hip: Secondary | ICD-10-CM | POA: Diagnosis not present

## 2020-08-09 ENCOUNTER — Other Ambulatory Visit: Payer: Self-pay | Admitting: *Deleted

## 2020-08-09 NOTE — Telephone Encounter (Signed)
Received call from patient.   Requested refill on Ativan and Hydrocodone/APAP.   Ok to refill??  Last office visit 06/10/2020.  Last refill 07/12/2020.

## 2020-08-10 MED ORDER — HYDROCODONE-ACETAMINOPHEN 5-325 MG PO TABS: 1.0000 | ORAL_TABLET | Freq: Four times a day (QID) | ORAL | 0 refills | Status: DC | PRN

## 2020-08-10 MED ORDER — LORAZEPAM 0.5 MG PO TABS: 0.5000 mg | ORAL_TABLET | Freq: Four times a day (QID) | ORAL | 0 refills | Status: DC | PRN

## 2020-08-11 DIAGNOSIS — M25551 Pain in right hip: Secondary | ICD-10-CM | POA: Diagnosis not present

## 2020-09-09 ENCOUNTER — Other Ambulatory Visit: Payer: Self-pay

## 2020-09-09 MED ORDER — HYDROCODONE-ACETAMINOPHEN 5-325 MG PO TABS: 1.0000 | ORAL_TABLET | Freq: Four times a day (QID) | ORAL | 0 refills | Status: AC | PRN

## 2020-09-09 MED ORDER — LORAZEPAM 0.5 MG PO TABS: 0.5000 mg | ORAL_TABLET | Freq: Four times a day (QID) | ORAL | 0 refills | Status: AC | PRN

## 2020-09-22 ENCOUNTER — Other Ambulatory Visit: Payer: Self-pay | Admitting: Family Medicine

## 2020-09-29 ENCOUNTER — Other Ambulatory Visit: Payer: Self-pay

## 2020-09-29 ENCOUNTER — Inpatient Hospital Stay (HOSPITAL_COMMUNITY)
Admission: EM | Admit: 2020-09-29 | Discharge: 2020-10-28 | DRG: 871 | Disposition: E | Payer: Medicare Other | Attending: Family Medicine | Admitting: Family Medicine

## 2020-09-29 ENCOUNTER — Inpatient Hospital Stay (HOSPITAL_COMMUNITY): Payer: Medicare Other

## 2020-09-29 ENCOUNTER — Emergency Department (HOSPITAL_COMMUNITY): Payer: Medicare Other

## 2020-09-29 ENCOUNTER — Encounter (HOSPITAL_COMMUNITY): Payer: Self-pay | Admitting: Emergency Medicine

## 2020-09-29 DIAGNOSIS — R7989 Other specified abnormal findings of blood chemistry: Secondary | ICD-10-CM | POA: Diagnosis not present

## 2020-09-29 DIAGNOSIS — N179 Acute kidney failure, unspecified: Secondary | ICD-10-CM | POA: Diagnosis present

## 2020-09-29 DIAGNOSIS — A419 Sepsis, unspecified organism: Secondary | ICD-10-CM | POA: Diagnosis not present

## 2020-09-29 DIAGNOSIS — R0689 Other abnormalities of breathing: Secondary | ICD-10-CM | POA: Diagnosis not present

## 2020-09-29 DIAGNOSIS — J1282 Pneumonia due to coronavirus disease 2019: Secondary | ICD-10-CM | POA: Diagnosis present

## 2020-09-29 DIAGNOSIS — Z780 Asymptomatic menopausal state: Secondary | ICD-10-CM

## 2020-09-29 DIAGNOSIS — Z885 Allergy status to narcotic agent status: Secondary | ICD-10-CM

## 2020-09-29 DIAGNOSIS — Z20822 Contact with and (suspected) exposure to covid-19: Secondary | ICD-10-CM

## 2020-09-29 DIAGNOSIS — I959 Hypotension, unspecified: Secondary | ICD-10-CM | POA: Diagnosis not present

## 2020-09-29 DIAGNOSIS — E872 Acidosis: Secondary | ICD-10-CM

## 2020-09-29 DIAGNOSIS — Z0184 Encounter for antibody response examination: Secondary | ICD-10-CM | POA: Diagnosis not present

## 2020-09-29 DIAGNOSIS — M25551 Pain in right hip: Secondary | ICD-10-CM

## 2020-09-29 DIAGNOSIS — E860 Dehydration: Secondary | ICD-10-CM

## 2020-09-29 DIAGNOSIS — J80 Acute respiratory distress syndrome: Secondary | ICD-10-CM | POA: Diagnosis present

## 2020-09-29 DIAGNOSIS — R6520 Severe sepsis without septic shock: Secondary | ICD-10-CM | POA: Diagnosis present

## 2020-09-29 DIAGNOSIS — A4189 Other specified sepsis: Principal | ICD-10-CM | POA: Diagnosis present

## 2020-09-29 DIAGNOSIS — D703 Neutropenia due to infection: Secondary | ICD-10-CM | POA: Diagnosis present

## 2020-09-29 DIAGNOSIS — M1990 Unspecified osteoarthritis, unspecified site: Secondary | ICD-10-CM | POA: Diagnosis present

## 2020-09-29 DIAGNOSIS — D709 Neutropenia, unspecified: Secondary | ICD-10-CM | POA: Diagnosis not present

## 2020-09-29 DIAGNOSIS — R54 Age-related physical debility: Secondary | ICD-10-CM | POA: Diagnosis present

## 2020-09-29 DIAGNOSIS — Z79899 Other long term (current) drug therapy: Secondary | ICD-10-CM | POA: Diagnosis not present

## 2020-09-29 DIAGNOSIS — Z79890 Hormone replacement therapy (postmenopausal): Secondary | ICD-10-CM

## 2020-09-29 DIAGNOSIS — Z66 Do not resuscitate: Secondary | ICD-10-CM | POA: Diagnosis not present

## 2020-09-29 DIAGNOSIS — F1120 Opioid dependence, uncomplicated: Secondary | ICD-10-CM | POA: Diagnosis present

## 2020-09-29 DIAGNOSIS — G4700 Insomnia, unspecified: Secondary | ICD-10-CM | POA: Diagnosis present

## 2020-09-29 DIAGNOSIS — U071 COVID-19: Secondary | ICD-10-CM | POA: Diagnosis not present

## 2020-09-29 DIAGNOSIS — D72810 Lymphocytopenia: Secondary | ICD-10-CM

## 2020-09-29 DIAGNOSIS — I10 Essential (primary) hypertension: Secondary | ICD-10-CM | POA: Diagnosis present

## 2020-09-29 DIAGNOSIS — R531 Weakness: Secondary | ICD-10-CM | POA: Diagnosis not present

## 2020-09-29 DIAGNOSIS — J9601 Acute respiratory failure with hypoxia: Secondary | ICD-10-CM | POA: Diagnosis present

## 2020-09-29 DIAGNOSIS — M1611 Unilateral primary osteoarthritis, right hip: Secondary | ICD-10-CM

## 2020-09-29 DIAGNOSIS — G8929 Other chronic pain: Secondary | ICD-10-CM | POA: Diagnosis present

## 2020-09-29 DIAGNOSIS — F32A Depression, unspecified: Secondary | ICD-10-CM | POA: Diagnosis present

## 2020-09-29 DIAGNOSIS — M25559 Pain in unspecified hip: Secondary | ICD-10-CM

## 2020-09-29 DIAGNOSIS — D688 Other specified coagulation defects: Secondary | ICD-10-CM | POA: Diagnosis present

## 2020-09-29 DIAGNOSIS — Z7189 Other specified counseling: Secondary | ICD-10-CM

## 2020-09-29 DIAGNOSIS — J189 Pneumonia, unspecified organism: Secondary | ICD-10-CM

## 2020-09-29 DIAGNOSIS — Z28310 Unvaccinated for covid-19: Secondary | ICD-10-CM | POA: Diagnosis not present

## 2020-09-29 DIAGNOSIS — R0602 Shortness of breath: Secondary | ICD-10-CM | POA: Diagnosis not present

## 2020-09-29 DIAGNOSIS — F419 Anxiety disorder, unspecified: Secondary | ICD-10-CM | POA: Diagnosis present

## 2020-09-29 HISTORY — DX: Anxiety disorder, unspecified: F41.9

## 2020-09-29 HISTORY — DX: Unspecified osteoarthritis, unspecified site: M19.90

## 2020-09-29 LAB — LACTIC ACID, PLASMA
Lactic Acid, Venous: 3.8 mmol/L (ref 0.5–1.9)
Lactic Acid, Venous: 3 mmol/L (ref 0.5–1.9)
Lactic Acid, Venous: 3.9 mmol/L (ref 0.5–1.9)

## 2020-09-29 LAB — MRSA NEXT GEN BY PCR, NASAL: MRSA by PCR Next Gen: DETECTED — AB

## 2020-09-29 LAB — CBC WITH DIFFERENTIAL/PLATELET
Abs Immature Granulocytes: 0.12 10*3/uL — ABNORMAL HIGH (ref 0.00–0.07)
Basophils Absolute: 0 10*3/uL (ref 0.0–0.1)
Basophils Relative: 1 %
Eosinophils Absolute: 0 10*3/uL (ref 0.0–0.5)
Eosinophils Relative: 0 %
HCT: 41.4 % (ref 36.0–46.0)
Hemoglobin: 13 g/dL (ref 12.0–15.0)
Immature Granulocytes: 13 %
Lymphocytes Relative: 26 %
Lymphs Abs: 0.2 10*3/uL — ABNORMAL LOW (ref 0.7–4.0)
MCH: 28.1 pg (ref 26.0–34.0)
MCHC: 31.4 g/dL (ref 30.0–36.0)
MCV: 89.4 fL (ref 80.0–100.0)
Monocytes Absolute: 0.1 10*3/uL (ref 0.1–1.0)
Monocytes Relative: 6 %
Neutro Abs: 0.5 10*3/uL — ABNORMAL LOW (ref 1.7–7.7)
Neutrophils Relative %: 54 %
Platelets: 149 10*3/uL — ABNORMAL LOW (ref 150–400)
RBC: 4.63 MIL/uL (ref 3.87–5.11)
RDW: 15.8 % — ABNORMAL HIGH (ref 11.5–15.5)
WBC: 0.9 10*3/uL — CL (ref 4.0–10.5)
nRBC: 0 % (ref 0.0–0.2)

## 2020-09-29 LAB — LACTATE DEHYDROGENASE: LDH: 203 U/L — ABNORMAL HIGH (ref 98–192)

## 2020-09-29 LAB — COMPREHENSIVE METABOLIC PANEL WITH GFR
ALT: 12 U/L (ref 0–44)
AST: 27 U/L (ref 15–41)
Albumin: 3.2 g/dL — ABNORMAL LOW (ref 3.5–5.0)
Alkaline Phosphatase: 36 U/L — ABNORMAL LOW (ref 38–126)
Anion gap: 15 (ref 5–15)
BUN: 35 mg/dL — ABNORMAL HIGH (ref 8–23)
CO2: 21 mmol/L — ABNORMAL LOW (ref 22–32)
Calcium: 8.4 mg/dL — ABNORMAL LOW (ref 8.9–10.3)
Chloride: 102 mmol/L (ref 98–111)
Creatinine, Ser: 2.37 mg/dL — ABNORMAL HIGH (ref 0.44–1.00)
GFR, Estimated: 21 mL/min — ABNORMAL LOW
Glucose, Bld: 78 mg/dL (ref 70–99)
Potassium: 3.5 mmol/L (ref 3.5–5.1)
Sodium: 138 mmol/L (ref 135–145)
Total Bilirubin: 0.7 mg/dL (ref 0.3–1.2)
Total Protein: 6.9 g/dL (ref 6.5–8.1)

## 2020-09-29 LAB — FIBRINOGEN: Fibrinogen: >800 mg/dL — ABNORMAL HIGH (ref 210–475)

## 2020-09-29 LAB — TRIGLYCERIDES: Triglycerides: 79 mg/dL

## 2020-09-29 LAB — FERRITIN: Ferritin: 139 ng/mL (ref 11–307)

## 2020-09-29 LAB — RESP PANEL BY RT-PCR (FLU A&B, COVID) ARPGX2
Influenza A by PCR: NEGATIVE
Influenza B by PCR: NEGATIVE
SARS Coronavirus 2 by RT PCR: POSITIVE — AB

## 2020-09-29 LAB — PROTIME-INR
INR: 3.6 — ABNORMAL HIGH (ref 0.8–1.2)
Prothrombin Time: 36.2 s — ABNORMAL HIGH (ref 11.4–15.2)

## 2020-09-29 LAB — D-DIMER, QUANTITATIVE: D-Dimer, Quant: 7.2 ug/mL-FEU — ABNORMAL HIGH (ref 0.00–0.50)

## 2020-09-29 LAB — C-REACTIVE PROTEIN: CRP: 38.6 mg/dL — ABNORMAL HIGH (ref <1.0)

## 2020-09-29 LAB — HIV ANTIBODY (ROUTINE TESTING W REFLEX): HIV Screen 4th Generation wRfx: NONREACTIVE

## 2020-09-29 LAB — APTT: aPTT: >200 seconds (ref 24–36)

## 2020-09-29 LAB — PROCALCITONIN: Procalcitonin: 97.77 ng/mL

## 2020-09-29 MED ORDER — SODIUM CHLORIDE 0.9 % IV BOLUS
1000.0000 mL | Freq: Once | INTRAVENOUS | Status: AC
  Administered 2020-09-29: 1000 mL via INTRAVENOUS

## 2020-09-29 MED ORDER — LORAZEPAM 2 MG/ML IJ SOLN
0.5000 mg | Freq: Two times a day (BID) | INTRAMUSCULAR | Status: DC | PRN
  Administered 2020-09-29: 0.5 mg via INTRAVENOUS
  Filled 2020-09-29: qty 1

## 2020-09-29 MED ORDER — MIDAZOLAM HCL 2 MG/2ML IJ SOLN
1.0000 mg | INTRAMUSCULAR | Status: DC | PRN
  Administered 2020-09-29 (×2): 4 mg via INTRAVENOUS
  Filled 2020-09-29 (×3): qty 4

## 2020-09-29 MED ORDER — LACTATED RINGERS IV SOLN: INTRAVENOUS | Status: DC

## 2020-09-29 MED ORDER — SODIUM CHLORIDE 0.9 % IV SOLN
200.0000 mg | Freq: Once | INTRAVENOUS | Status: AC
  Administered 2020-09-29: 200 mg via INTRAVENOUS
  Filled 2020-09-29: qty 40

## 2020-09-29 MED ORDER — VANCOMYCIN HCL IN DEXTROSE 1-5 GM/200ML-% IV SOLN
1000.0000 mg | Freq: Once | INTRAVENOUS | Status: AC
  Administered 2020-09-29: 1000 mg via INTRAVENOUS
  Filled 2020-09-29: qty 200

## 2020-09-29 MED ORDER — DEXMEDETOMIDINE HCL IN NACL 200 MCG/50ML IV SOLN
0.4000 ug/kg/h | INTRAVENOUS | Status: DC
  Administered 2020-09-29: 1.2 ug/kg/h via INTRAVENOUS
  Administered 2020-09-29: 0.4 ug/kg/h via INTRAVENOUS
  Filled 2020-09-29 (×2): qty 50

## 2020-09-29 MED ORDER — SODIUM CHLORIDE 0.9 % IV SOLN
1.0000 g | Freq: Once | INTRAVENOUS | Status: AC
  Administered 2020-09-29: 1 g via INTRAVENOUS
  Filled 2020-09-29: qty 10

## 2020-09-29 MED ORDER — DEXAMETHASONE SODIUM PHOSPHATE 10 MG/ML IJ SOLN: 6.0000 mg | INTRAMUSCULAR | Status: DC

## 2020-09-29 MED ORDER — TRAZODONE HCL 50 MG PO TABS
50.0000 mg | ORAL_TABLET | Freq: Every day | ORAL | Status: DC
  Filled 2020-09-29: qty 1

## 2020-09-29 MED ORDER — SODIUM CHLORIDE 0.9 % IV SOLN
1000.0000 mL | INTRAVENOUS | Status: DC
  Administered 2020-09-29: 1000 mL via INTRAVENOUS

## 2020-09-29 MED ORDER — VANCOMYCIN VARIABLE DOSE PER UNSTABLE RENAL FUNCTION (PHARMACIST DOSING): Status: DC

## 2020-09-29 MED ORDER — FENTANYL CITRATE (PF) 100 MCG/2ML IJ SOLN
25.0000 ug | INTRAMUSCULAR | Status: DC | PRN
  Administered 2020-09-29: 25 ug via INTRAVENOUS
  Administered 2020-09-30: 50 ug via INTRAVENOUS
  Filled 2020-09-29: qty 2

## 2020-09-29 MED ORDER — KETOROLAC TROMETHAMINE 30 MG/ML IJ SOLN
30.0000 mg | Freq: Once | INTRAMUSCULAR | Status: AC
  Administered 2020-09-29: 30 mg via INTRAVENOUS
  Filled 2020-09-29: qty 1

## 2020-09-29 MED ORDER — CHLORHEXIDINE GLUCONATE CLOTH 2 % EX PADS
6.0000 | MEDICATED_PAD | Freq: Every day | CUTANEOUS | Status: DC
  Administered 2020-09-29: 6 via TOPICAL

## 2020-09-29 MED ORDER — CHLORHEXIDINE GLUCONATE 0.12 % MT SOLN: 15.0000 mL | Freq: Two times a day (BID) | OROMUCOSAL | Status: DC

## 2020-09-29 MED ORDER — SODIUM CHLORIDE 0.9 % IV SOLN: 500.0000 mg | INTRAVENOUS | Status: DC

## 2020-09-29 MED ORDER — LORAZEPAM 2 MG/ML IJ SOLN: 0.5000 mg | Freq: Once | INTRAMUSCULAR | Status: DC

## 2020-09-29 MED ORDER — SODIUM CHLORIDE 0.9 % IV SOLN: 2.0000 g | INTRAVENOUS | Status: DC

## 2020-09-29 MED ORDER — SODIUM CHLORIDE 0.9 % IV SOLN: 100.0000 mg | Freq: Every day | INTRAVENOUS | Status: DC

## 2020-09-29 MED ORDER — DULOXETINE HCL 30 MG PO CPEP: 60.0000 mg | ORAL_CAPSULE | Freq: Every day | ORAL | Status: DC

## 2020-09-29 MED ORDER — HEPARIN (PORCINE) 25000 UT/250ML-% IV SOLN
700.0000 [IU]/h | INTRAVENOUS | Status: DC
  Administered 2020-09-29: 700 [IU]/h via INTRAVENOUS
  Filled 2020-09-29: qty 250

## 2020-09-29 MED ORDER — HEPARIN BOLUS VIA INFUSION
2500.0000 [IU] | Freq: Once | INTRAVENOUS | Status: AC
  Administered 2020-09-29: 2500 [IU] via INTRAVENOUS
  Filled 2020-09-29: qty 2500

## 2020-09-29 MED ORDER — METRONIDAZOLE 500 MG/100ML IV SOLN
500.0000 mg | Freq: Two times a day (BID) | INTRAVENOUS | Status: DC
  Administered 2020-09-29: 500 mg via INTRAVENOUS
  Filled 2020-09-29: qty 100

## 2020-09-29 MED ORDER — ORAL CARE MOUTH RINSE
15.0000 mL | Freq: Two times a day (BID) | OROMUCOSAL | Status: DC
  Administered 2020-09-29: 15 mL via OROMUCOSAL

## 2020-09-29 MED ORDER — IPRATROPIUM-ALBUTEROL 20-100 MCG/ACT IN AERS
1.0000 | INHALATION_SPRAY | Freq: Four times a day (QID) | RESPIRATORY_TRACT | Status: DC | PRN
  Filled 2020-09-29: qty 4

## 2020-09-29 MED ORDER — SODIUM CHLORIDE 0.9 % IV SOLN
500.0000 mg | INTRAVENOUS | Status: DC
  Administered 2020-09-29: 500 mg via INTRAVENOUS
  Filled 2020-09-29: qty 500

## 2020-09-29 MED ORDER — FENTANYL CITRATE (PF) 100 MCG/2ML IJ SOLN
25.0000 ug | INTRAMUSCULAR | Status: DC | PRN
  Administered 2020-09-29: 25 ug via INTRAVENOUS
  Filled 2020-09-29 (×2): qty 2

## 2020-09-29 MED ORDER — METHYLPREDNISOLONE SODIUM SUCC 40 MG IJ SOLR: 40.0000 mg | Freq: Two times a day (BID) | INTRAMUSCULAR | Status: DC

## 2020-09-29 MED ORDER — ORAL CARE MOUTH RINSE: 15.0000 mL | Freq: Two times a day (BID) | OROMUCOSAL | Status: DC

## 2020-09-29 MED ORDER — DEXAMETHASONE SODIUM PHOSPHATE 10 MG/ML IJ SOLN
10.0000 mg | Freq: Once | INTRAMUSCULAR | Status: AC
  Administered 2020-09-29: 10 mg via INTRAVENOUS
  Filled 2020-09-29: qty 1

## 2020-09-29 NOTE — Progress Notes (Signed)
eLink Physician-Brief Progress Note Patient Name: Rachel Kaufman DOB: 02-14-1946 MRN: [id number]   Date of Service  10/21/2020  HPI/Events of Note  Notified of PTT > 200. Patient is on a Heparin IV infusion managed by pharmacy consult.   eICU Interventions  Defer management of Heparin IV infusion to pharmacy.      Intervention Category Major Interventions: Other:  Raveena Hebdon Dennard Nip 10/17/2020, 8:25 PM

## 2020-09-29 NOTE — Sepsis Progress Note (Signed)
eLink is monitoring this Code Sepsis. °

## 2020-09-29 NOTE — ED Triage Notes (Signed)
Patient presents with complaints of worsening shortness of breath since Saturday, fatigue, malaise, crackles and tachypnea. With inhalation she reports a stabbing pain to the back.  HX arthritis, anxiety  EMS vitals: 90/58 BP 82 HR 32 RR

## 2020-09-29 NOTE — Progress Notes (Signed)
Bipap started at this time. Patient is agitated and gets anxious and air hungry quickly. 18/10 80% 14. At this time, SPO2 is 97% and RR is 41. RT will continue to monitor. We are waiting for some meds to help with agitation and air hunger.

## 2020-09-29 NOTE — H&P (Addendum)
History and Physical    Amirrah Quigley TFT:[id number] DOB: Jan 14, 1946 DOA: 10-15-2020  PCP: Donita Brooks, MD Patient coming from: Home  Chief Complaint: Shortness of breath  HPI: Rachel Kaufman is a 75 y.o. female with history of anxiety, depression, insomnia, osteoarthritis/chronic right hip pain and HTN brought to ED by EMS with progressive shortness of breath, dry cough, fatigue, malaise and chills.  Patient had progressive dyspnea for 3 to 4 days. She has dry cough, right-sided chest pain, malaise and chills.  Multiple family members tested positive for COVID-19.  She is unvaccinated for COVID-19.  She denies fever, URI symptoms, sore throat or myalgia but chronic right hip pain due to arthritis.  States severe pain in right hip and asking for pain medication. She says she takes Norco and naproxen at home.  She denies nausea, vomiting, dysphagia, abdominal pain, diarrhea or UTI symptoms.  She has not been moving a lot for the last 3 to 4 days due to weakness and fatigue.  She denies leg swelling, calf tenderness or prior history of blood clot.  She denies history of heart disease or CHF.  She denies family history of blood clot.  She denies smoking cigarette, drinking alcohol or recreational drug use.  She prefers to remain full code.   Per EDP, patient arrived on 4 L saturating in low 90s to mid 90s.  She desaturated to 85% at rest when taken off oxygen.  She was tachypneic to 40s.  Not tachycardic or hypotensive.  She desaturated to low 80s on 4 L and started on 15 L by NRB.  However, his oxygen saturation improved after we change the probe to the ER, and able to wean to 6 L by NRB.  Significant finding on ED work-up include leukopenia with neutropenia and lymphopenia, Cr 2.37 (baseline 0.8), BUN 35, lactic acid 3.8> 3.0, CRP 38.6, D-dimer 7.2, fibrinogen> 800, Pro-Cal 97, CXR with heterogeneous right lung consolidation.  EKG normal sinus rhythm.  Influenza and COVID-19 swab pending.  Cultures drawn.   Received Toradol, IV dexamethasone, IV CTX, azithromycin, 1 L of normal saline bolus, and hospitalist service called for admission  ROS All review of system negative except for pertinent positives and negatives as history of present illness above.  PMH Past Medical History:  Diagnosis Date   Anxiety    Arthritis    Depression    Insomnia    Postmenopausal HRT (hormone replacement therapy)    PSH Past Surgical History:  Procedure Laterality Date   BACK SURGERY     Fam HX Denies family history of blood clots.  Social Hx  reports that she has never smoked. She has never used smokeless tobacco. She reports current alcohol use. She reports current drug use. Drug: Marijuana.  Allergy Allergies  Allergen Reactions   Codeine Nausea And Vomiting   Home Meds Prior to Admission medications   Medication Sig Start Date End Date Taking? Authorizing Provider  Biotin 1000 MCG tablet Take 1,000 mcg by mouth 2 (two) times daily.   Yes [provider]  cholecalciferol (VITAMIN D3) 25 MCG (1000 UNIT) tablet Take 1,000 Units by mouth daily.   Yes [provider]  DULoxetine (CYMBALTA) 60 MG capsule TAKE 1 CAPSULE(60 MG) BY MOUTH DAILY Patient taking differently: Take 60 mg by mouth daily. 04/26/20  Yes Donita Brooks, MD  buPROPion (WELLBUTRIN XL) 300 MG 24 hr tablet TAKE 1 TABLET(300 MG) BY MOUTH EVERY MORNING Patient not taking: Reported on 2020/10/15 05/26/20   Pickard,  Priscille Heidelberg, MD  HYDROcodone-acetaminophen (NORCO/VICODIN) 5-325 MG tablet Take 1 tablet by mouth every 6 (six) hours as needed for moderate pain (DX: G89.4). 09/09/20   Donita Brooks, MD  LORazepam (ATIVAN) 0.5 MG tablet Take 1 tablet (0.5 mg total) by mouth every 6 (six) hours as needed for anxiety. 09/09/20   Donita Brooks, MD  losartan (COZAAR) 100 MG tablet Take 1 tablet (100 mg total) by mouth daily. 03/06/19   Donita Brooks, MD  naproxen sodium (ALEVE) 220 MG tablet Take 220 mg by mouth.     [provider]  traZODone (DESYREL) 100 MG tablet TAKE 2 TABLETS(200 MG) BY MOUTH AT BEDTIME 09/22/20   Donita Brooks, MD  vitamin B-12 (CYANOCOBALAMIN) 1000 MCG tablet Take 1,000 mcg by mouth daily.    [provider]  vitamin C (ASCORBIC ACID) 500 MG tablet Take 500 mg by mouth daily.    [provider]    Physical Exam: Vitals:   10/26/2020 1530 09/27/2020 1544 09/27/2020 1545 10/05/2020 1600  BP: 97/70  101/71   Pulse: 90 89 88 94  Resp: (!) 27 (!) 41 (!) 35 (!) 42  Temp:      TempSrc:      SpO2: (!) 82% 90% (!) 88% 96%    GENERAL: No acute distress.  Appears well.  HEENT: MMM.  Vision and hearing grossly intact.  NECK: Supple.  No apparent JVD.  RESP: 94 to 96% on 6 L by NRB.  Tachypneic.  Crackles and diminished aeration in right lung. CVS:  RRR. Heart sounds normal.  ABD/GI/GU: Bowel sounds present. Soft. Non tender.  MSK/EXT:  Moves extremities.  Limited ROM in right hip due to pain.  Muscle mass and subcu fat loss. SKIN: no apparent skin lesion or wound NEURO: Awake, alert and oriented appropriately.  No gross deficit.  PSYCH: Calm. Normal affect.  ENDO:   Personally Reviewed Radiological Exams DG Chest Port 1 View  Result Date: 10/06/2020 CLINICAL DATA:  Shortness of breath EXAM: PORTABLE CHEST 1 VIEW COMPARISON:  None. FINDINGS: The heart size and mediastinal contours are within normal limits. Extensive heterogeneous airspace opacity and consolidation throughout the right lung. The left lung is normally aerated. The visualized skeletal structures are unremarkable. IMPRESSION: Extensive heterogeneous airspace opacity and consolidation throughout the right lung, consistent with multifocal infection. Recommend follow-up radiographs in 6-8 weeks to ensure complete radiographic resolution and exclude malignancy. Electronically Signed   By: Lauralyn Primes M.D.   On: 10/04/2020 12:25     Personally Reviewed Labs: CBC: Recent Labs  Lab 10/25/2020 1252  WBC  0.9*  NEUTROABS 0.5*  HGB 13.0  HCT 41.4  MCV 89.4  PLT 149*   Basic Metabolic Panel: Recent Labs  Lab 10/25/2020 1252  NA 138  K 3.5  CL 102  CO2 21*  GLUCOSE 78  BUN 35*  CREATININE 2.37*  CALCIUM 8.4*   GFR: CrCl cannot be calculated (Unknown ideal weight.). Liver Function Tests: Recent Labs  Lab 10/11/2020 1252  AST 27  ALT 12  ALKPHOS 36*  BILITOT 0.7  PROT 6.9  ALBUMIN 3.2*   No results for input(s): LIPASE, AMYLASE in the last 168 hours. No results for input(s): AMMONIA in the last 168 hours. Coagulation Profile: No results for input(s): INR, PROTIME in the last 168 hours. Cardiac Enzymes: No results for input(s): CKTOTAL, CKMB, CKMBINDEX, TROPONINI in the last 168 hours. BNP (last 3 results) No results for input(s): PROBNP in the last 8760 hours.  HbA1C: No results for input(s): HGBA1C in the last 72 hours. CBG: No results for input(s): GLUCAP in the last 168 hours. Lipid Profile: Recent Labs    October 12, 2020 1252  TRIG 79   Thyroid Function Tests: No results for input(s): TSH, T4TOTAL, FREET4, T3FREE, THYROIDAB in the last 72 hours. Anemia Panel: Recent Labs    Oct 12, 2020 1252  FERRITIN 139   Urine analysis: No results found for: COLORURINE, APPEARANCEUR, LABSPEC, PHURINE, GLUCOSEU, HGBUR, BILIRUBINUR, KETONESUR, PROTEINUR, UROBILINOGEN, NITRITE, LEUKOCYTESUR  Sepsis Labs:  Lactic acid 3.8>> 3.0.  Personally Reviewed EKG:  Normal sinus rhythm.  Assessment/Plan Severe sepsis due to right lung pneumonia: POA.  Meets criteria with neutropenia, tachypnea, AKI, respiratory failure and lactic acidosis.  Patient is neutropenic and lymphopenic. Acute respiratory failure with hypoxia due to right lung pneumonia: Patient presents with respiratory symptoms.  CXR with heterogeneous right lung consolidation.  Procalcitonin markedly elevated.  Also have markedly elevated D-dimer and CRP.  Has COVID-19 exposure.  She is unvaccinated.  Seems to have increased oxygen  requirement while in ED going from 4 L by [place] to 15 L by NRB although saturation improved after changing the probe to her ear. -Admit to stepdown unit as PUI for COVID-19 -Received IV ceftriaxone and azithromycin-we will continue -Add IV Flagyl and IV vancomycin -Start IV heparin until we rule out VTE.  Not able to do CTA due to AKI.  VQ scan if COVID-negative.  -Check lower extremity venous Doppler to rule out a DVT. -Give additional 1 L of NS bolus followed by maintenance IVF -Follow-up blood cultures, respiratory cultures, COVID-19 and influenza PCR -Will initiate COVID treatment if COVID PCR positive.  She received dexamethasone in ED -Trend lactic acidosis -Check UDS -PCCM consulted -N.p.o. except sips with meds pending SLP eval  AKI/azotemia: Baseline Cr 0.8.  AKI likely due to sepsis, dehydration, NSAID and ARB. Recent Labs    06/10/20 1550 10/12/20 1252  BUN 27* 35*  CREATININE 0.87 2.37*  -IV fluid as above -Recheck renal function in the morning -Further work-up including renal US if worse.  Elevated D-dimer: -Empiric treatment and work-up as above  Neutropenia/lymphopenia: Absolute neutrophils 600.  Likely due to sepsis. -Neutropenic precaution  Chronic right hip pain/osteoarthritis-reports taking Norco and naproxen at home.  Seems to get 60 tablets of Norco in 3 months per narcotic database. -Check right hip x-ray -IV fentanyl as needed-but need to be cautious with her respiratory status. -Tylenol  Anxiety and depression: On Ativan at home.  Confirmed under narcotic database. -Continue home Ativan at reduced dose  Essential hypertension: Normotensive. -Hold home losartan  Addendum Goal of care counseling-Per PCCM, patient changed CODE STATUS to DNR/DNI but okay with trial of BiPAP.  If not survivable, wants to be comfortable.  CODE STATUS changed to DNR/DNI by PCCM.  DVT prophylaxis: On IV heparin for possible VTE  Code Status: DNR/DNI Family Communication:  Attempted to call patient's daughter but no answer.  Disposition Plan: Admit to stepdown unit Consults called: PCCM Admission status: Inpatient Level of care: Stepdown   Almon Hercules MD [facility]  If 7PM-7AM, please contact night-coverage [email or web address]  10-12-20, 4:18 PM

## 2020-09-29 NOTE — Progress Notes (Signed)
Pharmacy Antibiotic Note  Rachel Kaufman is a 75 y.o. female admitted on 10/19/2020 with shortness of breath, fatigue, and chest pain positive for COVID-19. Patient has family members with COVID. Pharmacy has been consulted for vancomycin dosing.  Plan: Vancomycin 1000 mg IV bolus x 1 then variable dosing for acute kidney injury Azithromycin/CTX/Flagyl per MD F/U renal function and MRSA PCR    Temp (24hrs), Avg:98.3 F (36.8 C), Min:98.3 F (36.8 C), Max:98.3 F (36.8 C)  Recent Labs  Lab 10/03/2020 1252 10/15/2020 1522  WBC 0.9*  --   CREATININE 2.37*  --   LATICACIDVEN 3.8* 3.0*    CrCl cannot be calculated (Unknown ideal weight.).    Allergies  Allergen Reactions   Codeine Nausea And Vomiting    A/ntimicrobials this admission: 8/3 vancomcyin >>  8/3 axithromcyin >>  8/3 CTX >>  8/3 Flagyl >>   Dose adjustments this admission:  Microbiology results: 8/3 BCx:  8/3 Sputum:   8/3 MRSA PCR:   Thank you for allowing pharmacy to be a part of this patient's care.  Luisa Hart D 10/22/2020 4:18 PM

## 2020-09-29 NOTE — Progress Notes (Signed)
ANTICOAGULATION CONSULT NOTE - Initial Consult  Pharmacy Consult for Heparin  Indication: Possible VTE  Allergies  Allergen Reactions   Codeine Nausea And Vomiting    Patient Measurements:   Heparin Dosing Weight: 46 kg (weight from 5/22)  Vital Signs: Temp: 98.3 F (36.8 C) (08/03 1115) Temp Source: Oral (08/03 1115) BP: 101/71 (08/03 1545) Pulse Rate: 94 (08/03 1600)  Labs: Recent Labs    October 07, 2020 1252  HGB 13.0  HCT 41.4  PLT 149*  CREATININE 2.37*    CrCl cannot be calculated (Unknown ideal weight.).   Medical History: Past Medical History:  Diagnosis Date   Anxiety    Arthritis    Depression    Insomnia    Postmenopausal HRT (hormone replacement therapy)    Assessment: 75 y/o F admitted with shortness of breath, fatigue, and right-sided chest pain. Pharmacy consulted to initiate heparin infusion for possible VTE.  D-dimer 7.2 Hgb 13, PLT 149  Goal of Therapy:  Heparin level 0.3-0.7 units/ml Monitor platelets by anticoagulation protocol: Yes   Plan:  Give 2500 units bolus x 1 Start heparin infusion at 700 units/hr Baseline aPTT and PT/INR Check anti-Xa level in 8 hours and daily while on heparin Continue to monitor H&H and platelets  Valentina Gu Oct 07, 2020,4:04 PM

## 2020-09-29 NOTE — Consult Note (Addendum)
NAME:  Rachel Kaufman, MRN:  [id number], DOB:  [birth year], LOS: 0 ADMISSION DATE:  10/21/2020, CONSULTATION DATE:  10/02/2020 REFERRING MD:  Alanda Slim TRH, CHIEF COMPLAINT:  Hypoxia   History of Present Illness:  75 yo F PMH depression, anxiety with BZD dependence, chronic pain with opioid dependence presented to [facility] ED 8/3 with SOB and fatigue, accompanied R sided chest pain. Patient states sx started 4 days prior to admission. Notes that her family has recently tested positive for COVID. Pt is not vaccinated. She was hypoxic in ED requiring 4LNC. At one point she was placed on NRB for low SpO2 but later was de-escalated to 6L after sat probe replaced.  Cxr revealed multifocal PNA. The patient was started on broad abx for possible CAP and admitted to [facility].   Labs: LDH 203 CRP 38 LA 3 PCT 97 Ddimer 7.2 Fibrinogen >800 WBC 0.9 Cr 2.37   TRH is requesting CCM consultation in this setting.    Pertinent  Medical History  Chronic pain Opioid dependence Anxiety BZD dependence Depression   Significant Hospital Events: Including procedures, antibiotic start and stop dates in addition to other pertinent events   8/3 admitted to [facility] with hypoxia, multifocal PNA. Started on abx for CAP / aspiration PNA. Started on hep gtt for elevated ddimer. CCM consulted.   Interim History / Subjective:  Patient arrived to SDU on NRB  She is complaining of dyspnea and her chronic pain   Objective   Blood pressure 101/71, pulse 94, temperature 98.1 F (36.7 C), resp. rate (!) 42, SpO2 96 %.        Intake/Output Summary (Last 24 hours) at 10/15/2020 1626 Last data filed at 10/14/2020 1450 Gross per 24 hour  Intake 100 ml  Output --  Net 100 ml   There were no vitals filed for this visit.  Examination: General: Acutely ill appearing frail, thin older adult F reclined in bed in mild distress HENT: NCAT pink mmm anicteric sclera NRB in place  Lungs: Symmetrical chest expansion, increased RR, deep respirations.   Cardiovascular: tachycardic, regular. Cap refill < 3sec Abdomen: very thin soft ndnt Extremities: no acute joint deformity no cyanosis or clubbing Neuro: AAOx3 following commands no focal deficit GU: defer Skin: very tan. C/d/w. Scattered healed tattoos  Resolved Hospital Problem list     Assessment & Plan:   Addendum: 75 Covid has resulted Positive   COVID PNA, with ARDS due to COVID (Present on admission) -still likely to have bacterial infection superimposed with PCT 97 P -will start steroids -baricitinib contraindicated with sepsis -already started on heparin  -BiPAP  -PRN CXR   __________________________________  Acute hypoxic respiratory failure Multifocal PNA R>L- suspect COVID-19 PNA, possible superimposed bacterial PNA -above conditions present on admisison  -would be quite surprised if COVID were not the driving influence here. Certainly is possible with hypercoagulability that there may be PE as well. With high PCT, leukopenia, possible bacterial process too.  P -contact airborne  -NPO -cont empiric abx. I feel less strongly about continuing flagyl and vanc, think we can most likely de-escalate to rocephin, azithro -SpO2 goal > 90  -defer COVID therapies until test is resulted  -at high risk for decompensation -- pt ok with BiPAP if becomes indicated but is DNR/I (see below GOC discussion)  Severe sepsis due to multifocal PNA with end organ damage (hypoxic respiratory failure, AKI, hypercoagulability) Leukopenia  -above condition present on admission -PCT 97 which does suggest something in addition to suspected COVID  P -think likely ok to de-escalate abx -follow Bcx, sputum cx, covid, mrsa swab  -trend WBC, fever curve   AKI -above condition present on admission -likely prerenal, poor PO intake with SOB P -IVF  -trend renal indices   Hypercoagulable state due to covid -above condition present on admission -heightened inflammatory and coagulable  state -- CRP 38, ddimer 7.2 fibrinogen >800 LDH 203 -- certainly follows a pattern seen in COVID infections however cant rule out other etiologies yet like PE, DVT, reactive, underlying AI/rheum though less likely based on clinical hx and presentation P -trend -low threshold for steroids  -hep per pharmacy  Lactic acidosis  -above condition present on admission -reflective of systemic illness P -trend, correct underlying cause  Chronic Pain Opioid Dependence  Anxiety BZD dependence Depression -above conditions present on admission -all being managed by fam med outpt. Takes BID vicodin and BID xanax, looks like for quite some time  P -need to continue some bzd and opioid while inpt, BZD withdrawals being the more concerning possibility w sz risk  -would certainly benefit from OP pain management given duration of opioid dependence, psych too with anxiety depression BZD dependence  -UDS -PRN fent and ativan ordered IV   GOC // DNR discussion -In talking with the patient about her thoughts/feelings about "life support," we discussed Code status. She said "I don't want any of that." I clarified -- currently she is a full code, and stated plainly that at present, our game plan if her heart were to stop or if she had a respiratory arrest -- we would perform ACLS as indicated, with intubation. She plainly responded that she does not want these measures. I asked her directly what she would like [place] to do if her heart stopped and she died. She replied "let me go." I confirmed with pt that I will update code status to DNR in reflection of this conversation -we discussed elective intubation -- she also does not want elective intubation for progressive respiratory failure. She is ok with trial of BiPAP if indicated.  -if she decompensated on BiPAP or if stuck on biPAP, would want comfort care    Best Practice (right click and "Reselect all SmartList Selections" daily)   Diet/type: NPO DVT  prophylaxis: systemic heparin GI prophylaxis: N/A Lines: N/A Foley:  N/A Code Status:  DNR Last date of multidisciplinary goals of care discussion [pending]  Labs   CBC: Recent Labs  Lab Oct 16, 2020 1252  WBC 0.9*  NEUTROABS 0.5*  HGB 13.0  HCT 41.4  MCV 89.4  PLT 149*    Basic Metabolic Panel: Recent Labs  Lab 16-Oct-2020 1252  NA 138  K 3.5  CL 102  CO2 21*  GLUCOSE 78  BUN 35*  CREATININE 2.37*  CALCIUM 8.4*   GFR: CrCl cannot be calculated (Unknown ideal weight.). Recent Labs  Lab 2020-10-16 1252 10-16-2020 1522  PROCALCITON 97.77  --   WBC 0.9*  --   LATICACIDVEN 3.8* 3.0*    Liver Function Tests: Recent Labs  Lab 2020/10/16 1252  AST 27  ALT 12  ALKPHOS 36*  BILITOT 0.7  PROT 6.9  ALBUMIN 3.2*   No results for input(s): LIPASE, AMYLASE in the last 168 hours. No results for input(s): AMMONIA in the last 168 hours.  ABG No results found for: PHART, PCO2ART, PO2ART, HCO3, TCO2, ACIDBASEDEF, O2SAT   Coagulation Profile: No results for input(s): INR, PROTIME in the last 168 hours.  Cardiac Enzymes: No results for input(s): CKTOTAL, CKMB,  CKMBINDEX, TROPONINI in the last 168 hours.  HbA1C: No results found for: HGBA1C  CBG: No results for input(s): GLUCAP in the last 168 hours.  Review of Systems:   Review of Systems  Constitutional:  Positive for chills and malaise/fatigue. Negative for diaphoresis, fever and weight loss.  HENT: Negative.    Eyes: Negative.   Respiratory:  Positive for cough and shortness of breath. Negative for hemoptysis, sputum production and wheezing.   Cardiovascular:  Positive for chest pain. Negative for palpitations, orthopnea, claudication and leg swelling.  Gastrointestinal: Negative.   Genitourinary: Negative.   Musculoskeletal:  Positive for back pain, joint pain and myalgias. Negative for neck pain.  Skin: Negative.   Neurological: Negative.   Endo/Heme/Allergies: Negative.   Psychiatric/Behavioral:  Positive for  depression. Negative for hallucinations, substance abuse and suicidal ideas. The patient is nervous/anxious and has insomnia.     Past Medical History:  She,  has a past medical history of Anxiety, Arthritis, Depression, Insomnia, and Postmenopausal HRT (hormone replacement therapy).   Surgical History:   Past Surgical History:  Procedure Laterality Date   BACK SURGERY       Social History:   reports that she has never smoked. She has never used smokeless tobacco. She reports current alcohol use. She reports current drug use. Drug: Marijuana.   Family History:  Her family history is not on file.   Allergies Allergies  Allergen Reactions   Codeine Nausea And Vomiting     Home Medications  Prior to Admission medications   Medication Sig Start Date End Date Taking? Authorizing Provider  Biotin 1000 MCG tablet Take 1,000 mcg by mouth 2 (two) times daily.   Yes [provider]  cholecalciferol (VITAMIN D3) 25 MCG (1000 UNIT) tablet Take 1,000 Units by mouth daily.   Yes [provider]  DULoxetine (CYMBALTA) 60 MG capsule TAKE 1 CAPSULE(60 MG) BY MOUTH DAILY Patient taking differently: Take 60 mg by mouth daily. 04/26/20  Yes Donita Brooks, MD  HYDROcodone-acetaminophen (NORCO/VICODIN) 5-325 MG tablet Take 1 tablet by mouth every 6 (six) hours as needed for moderate pain (DX: G89.4). 09/09/20  Yes Donita Brooks, MD  LORazepam (ATIVAN) 0.5 MG tablet Take 1 tablet (0.5 mg total) by mouth every 6 (six) hours as needed for anxiety. 09/09/20  Yes Donita Brooks, MD  naproxen sodium (ALEVE) 220 MG tablet Take 220 mg by mouth daily as needed (pain).   Yes [provider]  Omega-3 Fatty Acids (FISH OIL) 1000 MG CAPS Take 1,000 mg by mouth daily. Mega Red   Yes [provider]  traZODone (DESYREL) 100 MG tablet TAKE 2 TABLETS(200 MG) BY MOUTH AT BEDTIME Patient taking differently: Take 200 mg by mouth at bedtime. 09/22/20  Yes Donita Brooks, MD   vitamin B-12 (CYANOCOBALAMIN) 1000 MCG tablet Take 1,000 mcg by mouth daily.   Yes [provider]  vitamin C (ASCORBIC ACID) 500 MG tablet Take 500 mg by mouth daily.   Yes [provider]  buPROPion (WELLBUTRIN XL) 300 MG 24 hr tablet TAKE 1 TABLET(300 MG) BY MOUTH EVERY MORNING Patient not taking: No sig reported 05/26/20   Donita Brooks, MD  losartan (COZAAR) 100 MG tablet Take 1 tablet (100 mg total) by mouth daily. Patient not taking: No sig reported 03/06/19   Donita Brooks, MD     Critical care time: 50 minutes     CRITICAL CARE Performed by: Lanier Clam   Total critical care  time: 50 minutes  Critical care time was exclusive of separately billable procedures and treating other patients.  Critical care was necessary to treat or prevent imminent or life-threatening deterioration.  Critical care was time spent personally by me on the following activities: development of treatment plan with patient and/or surrogate as well as nursing, discussions with consultants, evaluation of patient's response to treatment, examination of patient, obtaining history from patient or surrogate, ordering and performing treatments and interventions, ordering and review of laboratory studies, ordering and review of radiographic studies, pulse oximetry and re-evaluation of patient's condition.  Tessie Fass MSN, AGACNP-BC  Pulmonary/Critical Care Medicine Amion for pager 2020-10-04, 5:21 PM  Xxxxxx  ATTESTATION & SIGNATURE   STAFF NOTE: I, Dr Lavinia Sharps have personally reviewed patient's available data, including medical history, events of note, physical examination and test results as part of my evaluation. I have discussed with resident/NP and other care providers such as pharmacist, RN and RRT.  In addition,  I personally evaluated patient and elicited key findings of   S: Date of admit 10/04/2020 with LOS 0 for today 2020/10/04 : Rachel Kaufman is  - achectic female,  with anxiety and depression.  She is unvaccinated against COVID.  Significant COVID in the family.  She now admitted with COVID-related hyper acute hypoxemic respiratory failure but she also has high D-dimer and also has an extremely high procalcitonin.  Chest x-ray with ARDS physiology.  Requiring high flow mask oxygen.  She is exposed to DO NOT INTUBATE and DO NOT RESUSCITATE to the nurse practitioner.  She is just been started on BiPAP.  She is already received 1 dose of dexamethasone. In talking to the daughter Andy Gauss patient has been feeling unwell since 09/24/2020 and has not been eating well.  She feels patient might be dehydrated.   has a past medical history of Anxiety, Arthritis, Depression, Insomnia, and Postmenopausal HRT (hormone replacement therapy).  has a past surgical history that includes Back surgery.  Immunization History  Administered Date(s) Administered   DTaP 02/28/2011   Fluad Quad(high Dose 65+) 03/06/2019   Influenza Split 11/02/2012   Influenza,inj,Quad PF,6+ Mos 01/09/2014   Pneumococcal Conjugate-13 02/22/2017   Pneumococcal Polysaccharide-23 01/09/2014   Tdap 02/28/2011     O:  Blood pressure 113/68, pulse (!) 102, temperature (!) 96.9 F (36.1 C), temperature source Axillary, resp. rate (!) 44, height  (1.575 m), weight 43.9 kg, SpO2 (!) 88 %.   Cachectic female on BiPAP respiratory rate 35 times a minute not paradoxical but using accessory muscles.  Look off ER.  According to the bedside nurse.  She was agitated but after BiPAP she is better.  She is not confused.  Looks dehydrated   LABS    PULMONARY No results for input(s): PHART, PCO2ART, PO2ART, HCO3, TCO2, O2SAT in the last 168 hours.  Invalid input(s): PCO2, PO2  CBC Recent Labs  Lab 2020-10-04 1252  HGB 13.0  HCT 41.4  WBC 0.9*  PLT 149*    COAGULATION No results for input(s): INR in the last 168 hours.  CARDIAC  No results for input(s): TROPONINI in the last 168  hours. No results for input(s): PROBNP in the last 168 hours.   CHEMISTRY Recent Labs  Lab 10/04/2020 1252  NA 138  K 3.5  CL 102  CO2 21*  GLUCOSE 78  BUN 35*  CREATININE 2.37*  CALCIUM 8.4*   Estimated Creatinine Clearance: 14.7 mL/min (A) (by C-G formula based on SCr of 2.37 mg/dL (  H)).   LIVER Recent Labs  Lab 04/06/2020 1252  AST 27  ALT 12  ALKPHOS 36*  BILITOT 0.7  PROT 6.9  ALBUMIN 3.2*     INFECTIOUS Recent Labs  Lab 04/06/2020 1252 04/06/2020 1522  LATICACIDVEN 3.8* 3.0*  PROCALCITON 97.77  --      ENDOCRINE CBG (last 3)  No results for input(s): GLUCAP in the last 72 hours.       IMAGING x24h  - image(s) personally visualized  -   highlighted in bold DG Chest Port 1 View  Result Date: 10/25/2020 CLINICAL DATA:  Shortness of breath EXAM: PORTABLE CHEST 1 VIEW COMPARISON:  None. FINDINGS: The heart size and mediastinal contours are within normal limits. Extensive heterogeneous airspace opacity and consolidation throughout the right lung. The left lung is normally aerated. The visualized skeletal structures are unremarkable. IMPRESSION: Extensive heterogeneous airspace opacity and consolidation throughout the right lung, consistent with multifocal infection. Recommend follow-up radiographs in 6-8 weeks to ensure complete radiographic resolution and exclude malignancy. Electronically Signed   By: Lauralyn PrimesAlex  Bibbey M.D.   On: 12/23/20 12:25   DG HIP UNILAT WITH PELVIS 2-3 VIEWS RIGHT  Result Date: 10/20/2020 CLINICAL DATA:  Right hip pain, COVID-19 EXAM: DG HIP (WITH OR WITHOUT PELVIS) 2-3V RIGHT COMPARISON:  06/22/2020 FINDINGS: Frontal view of the pelvis as well as frontal and frogleg lateral views of the right hip are obtained. There are no acute displaced fractures. Severe right hip osteoarthritis is again noted, with complete loss of joint space, subchondral cyst formation, and bony remodeling of the femoral head and acetabulum. Left hip is unremarkable.  Prominent lower lumbar degenerative changes are stable. IMPRESSION: 1. Stable severe right hip osteoarthritis.  No acute fracture. Electronically Signed   By: Sharlet SalinaMichael  Brown M.D.   On: 12/23/20 18:31      A:  Acute hypoxic respiratory failure with ARDS physiology secondary to COVID-19.  High probably for associated community-acquired pneumonia because of high procalcitonin and pulm infiltrates -associated severe sepsis  Elevated D-dimer due to COVID-19 high probably for pulmonary embolism/DVT  Acute kidney injury probably due to sepsis and dehydration  Leukopenia probably secondary to severe community-acquired pneumonia and sepsis Bad prognostic sign    P: COVID-19 treatment: Steroids and remdesivir.  Hold off on immunomodulators given high procalcitonin  Severe sepsis/community-acquired pneumonia: Treat empirically for CAP.  Check Legionella and urine strep antigen  Severe leukopenia: Monitor  Elevated D-dimer: Probably due to sepsis/COVID-19/hypercoagulable state: Start empiric heparin infusion.  Get lower extremity DVT rule out.  Get echo.  Acute hypoxemic respiratory failure: She again expressed in front of the nurse and the respiratory therapist she is DO NOT INTUBATE and DO NOT RESUSCITATE.  As personally spoke to daughter Andy GaussSarah Gutierrez no CODE STATUS confirmed.  We will treat with BiPAP and oxygen for pulse ox goal greater than 92%.  We will also give fentanyl and Versed for air hunger  Class IV dyspnea along with underlying anxiety/depression: BiPAP, fentanyl as needed, Versed and also Precedex infusion   Overall prognosis appears very grim.  If she declines then we will have to converted on fentanyl/morphine infusion and keep her comfortable.  I expressed this and conveyed this with the daughter and she fully understands   Anti-infectives (From admission, onward)    Start     Dose/Rate Route Frequency Ordered Stop   09/28/2020 1700  azithromycin (ZITHROMAX) 500 mg in  sodium chloride 0.9 % 250 mL IVPB        500  mg 250 mL/hr over 60 Minutes Intravenous Every 24 hours 09/27/2020 1622 10/04/20 1659   10/20/20 1600  cefTRIAXone (ROCEPHIN) 2 g in sodium chloride 0.9 % 100 mL IVPB        2 g 200 mL/hr over 30 Minutes Intravenous Every 24 hours 10/09/2020 1622 10/05/20 1559   October 20, 2020 1000  remdesivir 100 mg in sodium chloride 0.9 % 100 mL IVPB       See Hyperspace for full Linked Orders Report.   100 mg 200 mL/hr over 30 Minutes Intravenous Daily 10/03/2020 1827 10/04/20 0959   10/12/2020 1930  remdesivir 200 mg in sodium chloride 0.9% 250 mL IVPB       See Hyperspace for full Linked Orders Report.   200 mg 580 mL/hr over 30 Minutes Intravenous Once 10/05/2020 1827     10/20/2020 1630  vancomycin (VANCOCIN) IVPB 1000 mg/200 mL premix        1,000 mg 200 mL/hr over 60 Minutes Intravenous  Once 10/24/2020 1617     10/04/2020 1622  vancomycin variable dose per unstable renal function (pharmacist dosing)         Does not apply See admin instructions 10/07/2020 1634     10/17/2020 1600  metroNIDAZOLE (FLAGYL) IVPB 500 mg        500 mg 100 mL/hr over 60 Minutes Intravenous Every 12 hours 10/20/2020 1545     10/06/2020 1330  cefTRIAXone (ROCEPHIN) 1 g in sodium chloride 0.9 % 100 mL IVPB        1 g 200 mL/hr over 30 Minutes Intravenous  Once 10/15/2020 1316 10/14/2020 1450   10/05/2020 1330  azithromycin (ZITHROMAX) 500 mg in sodium chloride 0.9 % 250 mL IVPB  Status:  Discontinued        500 mg 250 mL/hr over 60 Minutes Intravenous Every 24 hours 10/06/2020 1316 10/06/2020 1656        Rest per NP/medical resident whose note is outlined above and that I agree with  The patient is critically ill with multiple organ systems failure and requires high complexity decision making for assessment and support, frequent evaluation and titration of therapies, application of advanced monitoring technologies and extensive interpretation of multiple databases.   Critical Care Time devoted to patient  care services described in this note is  45  Minutes. This time reflects time of care of this signee Dr Kalman Shan. This critical care time does not reflect procedure time, or teaching time or supervisory time of PA/NP/Med student/Med Resident etc but could involve care discussion time     Dr. Kalman Shan, M.D., [facility].C.P Pulmonary and Critical Care Medicine Staff Physician [facility] Pager: [phone], If no answer or between  15:00h - 7:00h: call [phone]  09/27/2020 6:35 PM

## 2020-09-29 NOTE — ED Provider Notes (Addendum)
[facility]-EMERGENCY DEPT Provider Note   CSN: [id number] Arrival date & time: 10/08/2020  1103     History Chief Complaint  Patient presents with   Shortness of Breath   Fatigue    Rachel Kaufman is a 75 y.o. female.  Pt presents to the ED today with sob, fatigue, and right sided cp.  The pt said her whole family has tested + for Covid.  She is not vaccinated.  She was put on 4L oxygen by EMS, but they did not tell us her RA O2 sat.  She is not normally on oxygen.       Past Medical History:  Diagnosis Date   Anxiety    Arthritis    Depression    Insomnia    Postmenopausal HRT (hormone replacement therapy)     Patient Active Problem List   Diagnosis Date Noted   Acute respiratory failure with hypoxia (HCC) February 27, 2021   Pain in joint of right hip 07/20/2020   Insomnia    Postmenopausal HRT (hormone replacement therapy)     Past Surgical History:  Procedure Laterality Date   BACK SURGERY       OB History   No obstetric history on file.     History reviewed. No pertinent family history.  Social History   Tobacco Use   Smoking status: Never   Smokeless tobacco: Never  Substance Use Topics   Alcohol use: Yes   Drug use: Yes    Types: Marijuana    Home Medications Prior to Admission medications   Medication Sig Start Date End Date Taking? Authorizing Provider  Biotin 1000 MCG tablet Take 1,000 mcg by mouth 3 (three) times daily.    [provider]  buPROPion (WELLBUTRIN XL) 300 MG 24 hr tablet TAKE 1 TABLET(300 MG) BY MOUTH EVERY MORNING 05/26/20   Donita BrooksPickard, Warren T, MD  cholecalciferol (VITAMIN D3) 25 MCG (1000 UNIT) tablet Take 1,000 Units by mouth daily.    [provider]  DULoxetine (CYMBALTA) 60 MG capsule TAKE 1 CAPSULE(60 MG) BY MOUTH DAILY 04/26/20   Donita BrooksPickard, Warren T, MD  fish oil-omega-3 fatty acids 1000 MG capsule Take 2 g by mouth daily.    [provider]  HYDROcodone-acetaminophen (NORCO/VICODIN)  5-325 MG tablet Take 1 tablet by mouth every 6 (six) hours as needed for moderate pain (DX: G89.4). 09/09/20   Donita BrooksPickard, Warren T, MD  LORazepam (ATIVAN) 0.5 MG tablet Take 1 tablet (0.5 mg total) by mouth every 6 (six) hours as needed for anxiety. 09/09/20   Donita BrooksPickard, Warren T, MD  losartan (COZAAR) 100 MG tablet Take 1 tablet (100 mg total) by mouth daily. 03/06/19   Donita BrooksPickard, Warren T, MD  naproxen sodium (ALEVE) 220 MG tablet Take 220 mg by mouth.    [provider]  traZODone (DESYREL) 100 MG tablet TAKE 2 TABLETS(200 MG) BY MOUTH AT BEDTIME 09/22/20   Donita BrooksPickard, Warren T, MD  vitamin B-12 (CYANOCOBALAMIN) 1000 MCG tablet Take 1,000 mcg by mouth daily.    [provider]  vitamin C (ASCORBIC ACID) 500 MG tablet Take 500 mg by mouth daily.    [provider]    Allergies    Codeine  Review of Systems   Review of Systems  Constitutional:  Positive for fatigue and fever.  Respiratory:  Positive for cough and shortness of breath.   Cardiovascular:  Positive for chest pain.  Musculoskeletal:  Positive for myalgias.  All other systems reviewed and are negative.  Physical  Exam Updated Vital Signs BP 99/64   Pulse 88   Temp 98.3 F (36.8 C) (Oral)   Resp (!) 34   SpO2 93%   Physical Exam Vitals and nursing note reviewed.  Constitutional:      General: She is in acute distress.     Appearance: She is well-developed.  HENT:     Head: Normocephalic and atraumatic.     Mouth/Throat:     Mouth: Mucous membranes are dry.  Eyes:     Extraocular Movements: Extraocular movements intact.     Pupils: Pupils are equal, round, and reactive to light.  Cardiovascular:     Rate and Rhythm: Normal rate and regular rhythm.  Pulmonary:     Effort: Tachypnea present.     Breath sounds: Wheezing present.  Abdominal:     General: Bowel sounds are normal.     Palpations: Abdomen is soft.  Musculoskeletal:        General: Normal range of motion.     Cervical back: Normal  range of motion and neck supple.  Skin:    General: Skin is warm.     Capillary Refill: Capillary refill takes less than 2 seconds.  Neurological:     General: No focal deficit present.     Mental Status: She is alert and oriented to person, place, and time.  Psychiatric:        Mood and Affect: Mood normal.        Behavior: Behavior normal.    ED Results / Procedures / Treatments   Labs (all labs ordered are listed, but only abnormal results are displayed) Labs Reviewed  LACTIC ACID, PLASMA - Abnormal; Notable for the following components:      Result Value   Lactic Acid, Venous 3.8 (*)    All other components within normal limits  CBC WITH DIFFERENTIAL/PLATELET - Abnormal; Notable for the following components:   WBC 0.9 (*)    RDW 15.8 (*)    Platelets 149 (*)    Neutro Abs 0.5 (*)    Lymphs Abs 0.2 (*)    Abs Immature Granulocytes 0.12 (*)    All other components within normal limits  COMPREHENSIVE METABOLIC PANEL - Abnormal; Notable for the following components:   CO2 21 (*)    BUN 35 (*)    Creatinine, Ser 2.37 (*)    Calcium 8.4 (*)    Albumin 3.2 (*)    Alkaline Phosphatase 36 (*)    GFR, Estimated 21 (*)    All other components within normal limits  D-DIMER, QUANTITATIVE - Abnormal; Notable for the following components:   D-Dimer, Quant 7.20 (*)    All other components within normal limits  LACTATE DEHYDROGENASE - Abnormal; Notable for the following components:   LDH 203 (*)    All other components within normal limits  FIBRINOGEN - Abnormal; Notable for the following components:   Fibrinogen >800 (*)    All other components within normal limits  C-REACTIVE PROTEIN - Abnormal; Notable for the following components:   CRP 38.6 (*)    All other components within normal limits  RESP PANEL BY RT-PCR (FLU A&B, COVID) ARPGX2  CULTURE, BLOOD (ROUTINE X 2)  CULTURE, BLOOD (ROUTINE X 2)  PROCALCITONIN  FERRITIN  TRIGLYCERIDES  LACTIC ACID, PLASMA    EKG EKG  Interpretation  Date/Time:  Wednesday September 29 2020 11:36:23 EDT Ventricular Rate:  80 PR Interval:  150 QRS Duration: 84 QT Interval:  336 QTC Calculation: 388 R  Axis:   41 Text Interpretation: Sinus rhythm Probable left atrial enlargement No significant change since last tracing Confirmed by Jacalyn Lefevre [phone]) on Oct 19, 2020 11:57:57 AM  Radiology DG Chest Port 1 View  Result Date: 2020-10-19 CLINICAL DATA:  Shortness of breath EXAM: PORTABLE CHEST 1 VIEW COMPARISON:  None. FINDINGS: The heart size and mediastinal contours are within normal limits. Extensive heterogeneous airspace opacity and consolidation throughout the right lung. The left lung is normally aerated. The visualized skeletal structures are unremarkable. IMPRESSION: Extensive heterogeneous airspace opacity and consolidation throughout the right lung, consistent with multifocal infection. Recommend follow-up radiographs in 6-8 weeks to ensure complete radiographic resolution and exclude malignancy. Electronically Signed   By: Lauralyn Primes M.D.   On: 2020/10/19 12:25    Procedures Procedures   Medications Ordered in ED Medications  0.9 %  sodium chloride infusion (1,000 mLs Intravenous New Bag/Given 10/19/2020 1258)  azithromycin (ZITHROMAX) 500 mg in sodium chloride 0.9 % 250 mL IVPB (has no administration in time range)  sodium chloride 0.9 % bolus 1,000 mL (has no administration in time range)  LORazepam (ATIVAN) injection 0.5 mg (has no administration in time range)  lactated ringers infusion (has no administration in time range)  ketorolac (TORADOL) 30 MG/ML injection 30 mg (30 mg Intravenous Given Oct 19, 2020 1253)  dexamethasone (DECADRON) injection 10 mg (10 mg Intravenous Given 2020/10/19 1253)  cefTRIAXone (ROCEPHIN) 1 g in sodium chloride 0.9 % 100 mL IVPB (1 g Intravenous New Bag/Given Oct 19, 2020 1420)    ED Course  I have reviewed the triage vital signs and the nursing notes.  Pertinent labs & imaging results that were  available during my care of the patient were reviewed by me and considered in my medical decision making (see chart for details).    MDM Rules/Calculators/A&P                           Pt has a significant pneumonia on the right.  She is given rocephin/zithromax.  O2 sat drops quickly to 85% when oxygen is removed.  She is put back on 4L via [place] (she does not normally wear oxygen).  Bps are soft and so she is given IVFs.  Pt given 2L NS prior to code sepsis getting called.  Pt d/w Dr. Alanda Slim ([facility]) for admission.  CRITICAL CARE Performed by: Jacalyn Lefevre   Total critical care time: 30 minutes  Critical care time was exclusive of separately billable procedures and treating other patients.  Critical care was necessary to treat or prevent imminent or life-threatening deterioration.  Critical care was time spent personally by me on the following activities: development of treatment plan with patient and/or surrogate as well as nursing, discussions with consultants, evaluation of patient's response to treatment, examination of patient, obtaining history from patient or surrogate, ordering and performing treatments and interventions, ordering and review of laboratory studies, ordering and review of radiographic studies, pulse oximetry and re-evaluation of patient's condition.   Rachel Kaufman was evaluated in Emergency Department on 2020/10/19 for the symptoms described in the history of present illness. She was evaluated in the context of the global COVID-19 pandemic, which necessitated consideration that the patient might be at risk for infection with the SARS-CoV-2 virus that causes COVID-19. Institutional protocols and algorithms that pertain to the evaluation of patients at risk for COVID-19 are in a state of rapid change based on information released by regulatory bodies including the CDC and federal and state organizations.  These policies and algorithms were followed during the patient's care in  the ED.  Final Clinical Impression(s) / ED Diagnoses Final diagnoses:  Multifocal pneumonia  Dehydration  AKI (acute kidney injury) [facility])    Rx / DC Orders ED Discharge Orders     None        Jacalyn Lefevre, MD 10/12/2020 3354    Jacalyn Lefevre, MD Oct 12, 2020 1529

## 2020-09-30 LAB — SAR COV2 SEROLOGY (COVID19)AB(IGG),IA: SARS-CoV-2 Ab, IgG: NONREACTIVE

## 2020-09-30 MED ORDER — NALOXONE HCL 0.4 MG/ML IJ SOLN
INTRAMUSCULAR | Status: AC
  Administered 2020-09-30: 0.4 mg via INTRAVENOUS
  Filled 2020-09-30: qty 1

## 2020-09-30 MED ORDER — NALOXONE HCL 0.4 MG/ML IJ SOLN
INTRAMUSCULAR | Status: AC
  Administered 2020-09-30: 0.4 mg via INTRAVENOUS
  Filled 2020-09-30: qty 2

## 2020-09-30 MED ORDER — NALOXONE HCL 0.4 MG/ML IJ SOLN
0.4000 mg | INTRAMUSCULAR | Status: DC | PRN
  Administered 2020-09-30: 0.4 mg via INTRAVENOUS

## 2020-09-30 MED ORDER — PHENYLEPHRINE HCL-NACL 20-0.9 MG/250ML-% IV SOLN
25.0000 ug/min | INTRAVENOUS | Status: DC
Start: 2020-09-30 — End: 2020-09-30

## 2020-09-30 MED ORDER — PHENYLEPHRINE 40 MCG/ML (10ML) SYRINGE FOR IV PUSH (FOR BLOOD PRESSURE SUPPORT)
PREFILLED_SYRINGE | INTRAVENOUS | Status: AC
  Filled 2020-09-30: qty 10

## 2020-09-30 MED ORDER — FENTANYL CITRATE (PF) 100 MCG/2ML IJ SOLN: 12.5000 ug | INTRAMUSCULAR | Status: DC | PRN

## 2020-09-30 MED ORDER — SODIUM CHLORIDE 0.9 % IV SOLN: 250.0000 mL | INTRAVENOUS | Status: DC

## 2020-10-01 LAB — BLOOD CULTURE ID PANEL (REFLEXED) - BCID2

## 2020-10-05 LAB — CULTURE, BLOOD (ROUTINE X 2)
Culture: NO GROWTH
Special Requests: ADEQUATE

## 2020-10-05 LAB — PATHOLOGIST SMEAR REVIEW

## 2020-10-13 DIAGNOSIS — R7989 Other specified abnormal findings of blood chemistry: Secondary | ICD-10-CM | POA: Diagnosis present

## 2020-10-13 DIAGNOSIS — R6520 Severe sepsis without septic shock: Secondary | ICD-10-CM | POA: Diagnosis present

## 2020-10-13 DIAGNOSIS — N179 Acute kidney failure, unspecified: Secondary | ICD-10-CM | POA: Diagnosis present

## 2020-10-13 DIAGNOSIS — J189 Pneumonia, unspecified organism: Secondary | ICD-10-CM | POA: Diagnosis present

## 2020-10-13 DIAGNOSIS — A419 Sepsis, unspecified organism: Secondary | ICD-10-CM | POA: Diagnosis present

## 2020-10-13 DIAGNOSIS — D703 Neutropenia due to infection: Secondary | ICD-10-CM | POA: Diagnosis present

## 2020-10-13 DIAGNOSIS — Z66 Do not resuscitate: Secondary | ICD-10-CM | POA: Diagnosis present

## 2020-10-28 NOTE — Progress Notes (Signed)
Wasted 4 mg Versed and 50 mcg Fentanyl WIS witnessed by Thurmon Fair RN

## 2020-10-28 NOTE — Progress Notes (Signed)
PHARMACY - PHYSICIAN COMMUNICATION CRITICAL VALUE ALERT - BLOOD CULTURE IDENTIFICATION (BCID)  Rachel Kaufman is an 75 y.o. female who presented to [facility] on 09/27/2020 with a chief complaint of shortness of breath, fatigue, and chest pain.  Assessment:  GPC's in anaerobic bottle (1/4) reported as staph species. BCID as staph aureus with MecA and MREJ resistance  Name of physician (or Provider) Contacted: N/A  Current antibiotics: N/A  Changes to prescribed antibiotics recommended:  Patient deceased 25-Oct-2020  No results found for this or any previous visit.  Rexford Maus 10/01/2020  9:31 AM

## 2020-10-28 NOTE — Progress Notes (Signed)
     OVERNIGHT PROGRESS REPORT  Notified by RN that patient has expired at 0107 Hrs.  Patient was DNR/DNI  2 RN verified.  Family was not immediately available to RN but were reached by phone and were en-route to the hospital.  Family member arrived and spoke to Dr Arsenio Loader via video link (E-Link)   Chinita Greenland MSNA MSN ACNPC-AG Acute Care Nurse Practitioner [facility] [place]

## 2020-10-28 NOTE — Progress Notes (Signed)
eLink Physician-Brief Progress Note Patient Name: Rachel Kaufman DOB: May 07, 1945 MRN: [id number]   Date of Service  10/24/2020  HPI/Events of Note  Patient passed in spite of Narcan 0.4 mg IV X 5 and Phenylephrine IV infusion.   eICU Interventions  Attempted to call daughter, Rachel Kaufman, to inform her of the patient's passing, however, her line was busy. Spoke with daughter, Rachel Kaufman, at bedside via video and informed her of her mother's passing.     Intervention Category Major Interventions: Other:  Jayleen Afonso Dennard Nip 10/03/2020, 1:17 AM

## 2020-10-28 NOTE — Death Summary Note (Signed)
DEATH SUMMARY   Patient Details  Name: Rachel Kaufman MRN: [id number] DOB: 01/16/1946  Admission/Discharge Information   Admit Date:  10/23/20  Date of Death: Date of Death: 10-24-2020  Time of Death: Time of Death: 0107  Length of Stay: 1  Referring Physician: Donita Brooks, MD   Reason(s) for Hospitalization  Shortness of breath  Diagnoses  Preliminary cause of death: Severe sepsis [facility]) Secondary Diagnoses (including complications and co-morbidities):  Active Problems:   Acute respiratory failure with hypoxia (HCC)   COVID-19   Severe sepsis (HCC)   Pneumonia involving right lung   AKI (acute kidney injury) (HCC)   Azotemia   Neutropenia due to infection [facility])   DNR (do not resuscitate)   Brief Hospital Course (including significant findings, care, treatment, and services provided and events leading to death)  Rachel Kaufman is a 74 y.o. year old female with history of anxiety, depression, insomnia, osteoarthritis, chronic Pain, hypertension brought to ED by EMS with progressive shortness of breath, dry cough, fatigue, malaise and chills, and admitted for severe sepsis due to right lung pneumonia and acute respiratory failure with hypoxia.  She also had an AKI with azotemia.  She was resuscitated with IV fluid and started on broad-spectrum antibiotics.  She was also started on IV heparin empirically for elevated D-dimer..  She had progressive hypoxemia with increased oxygen requirement. COVID-19 test returned positive.  Pulmonology consulted, and prognosis felt to be very grim.  CODE STATUS changed to DNR/DNI.  Patient continued to have worsening respiratory distress and mental status change, and  passed away about 1:07 AM the same night.  Pertinent Labs and Studies  Significant Diagnostic Studies DG Chest Port 1 View  Result Date: 2020/10/23 CLINICAL DATA:  Shortness of breath EXAM: PORTABLE CHEST 1 VIEW COMPARISON:  None. FINDINGS: The heart size and mediastinal contours are  within normal limits. Extensive heterogeneous airspace opacity and consolidation throughout the right lung. The left lung is normally aerated. The visualized skeletal structures are unremarkable. IMPRESSION: Extensive heterogeneous airspace opacity and consolidation throughout the right lung, consistent with multifocal infection. Recommend follow-up radiographs in 6-8 weeks to ensure complete radiographic resolution and exclude malignancy. Electronically Signed   By: Lauralyn Primes M.D.   On: 10/23/20 12:25   DG HIP UNILAT WITH PELVIS 2-3 VIEWS RIGHT  Result Date: 10-23-2020 CLINICAL DATA:  Right hip pain, COVID-19 EXAM: DG HIP (WITH OR WITHOUT PELVIS) 2-3V RIGHT COMPARISON:  06/22/2020 FINDINGS: Frontal view of the pelvis as well as frontal and frogleg lateral views of the right hip are obtained. There are no acute displaced fractures. Severe right hip osteoarthritis is again noted, with complete loss of joint space, subchondral cyst formation, and bony remodeling of the femoral head and acetabulum. Left hip is unremarkable. Prominent lower lumbar degenerative changes are stable. IMPRESSION: 1. Stable severe right hip osteoarthritis.  No acute fracture. Electronically Signed   By: Sharlet Salina M.D.   On: 23-Oct-2020 18:31    Microbiology No results found for this or any previous visit (from the past 240 hour(s)).  Lab Basic Metabolic Panel: No results for input(s): NA, K, CL, CO2, GLUCOSE, BUN, CREATININE, CALCIUM, MG, PHOS in the last 168 hours. Liver Function Tests: No results for input(s): AST, ALT, ALKPHOS, BILITOT, PROT, ALBUMIN in the last 168 hours. No results for input(s): LIPASE, AMYLASE in the last 168 hours. No results for input(s): AMMONIA in the last 168 hours. CBC: No results for input(s): WBC, NEUTROABS, HGB, HCT, MCV, PLT in the  last 168 hours. Cardiac Enzymes: No results for input(s): CKTOTAL, CKMB, CKMBINDEX, TROPONINI in the last 168 hours. Sepsis Labs: No results for  input(s): PROCALCITON, WBC, LATICACIDVEN in the last 168 hours.  Procedures/Operations  None   Bethel Sirois T Tarius Stangelo 10/13/2020, 9:00 AM

## 2020-10-28 NOTE — Consult Note (Signed)
Provided spiritual care and grief support to patients daughter following patient death.

## 2020-10-28 NOTE — Progress Notes (Signed)
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

## 2020-10-28 NOTE — Progress Notes (Signed)
eLink Physician-Brief Progress Note Patient Name: Rachel Kaufman DOB: December 19, 1945 MRN: [id number]   Date of Service  10/17/2020  HPI/Events of Note  Nursing reports that patient became unresponsive post Fentanyl 50 mcg IV. Minimal improvement with Narcan 0.4 mg X 2. However, patient now has improved BP and O2 saturation. Patient is a DNR/DNI. Spoke with daughter, [name],  about her mother's deterioration tonight. She wants to continue to try to treat her mother aggressively short of intubation and CPR.  eICU Interventions  Plan: Narcan 0.4 mg IV PRN. Decrease Fentanyl dose to 12.5-25 mcg IV Q 2 hours PRN. Phenylephrine IV infusion via PIV. Titrate to MAP >= 65.     Intervention Category Major Interventions: Change in mental status - evaluation and management  Janari Gagner Dennard Nip 10/03/2020, 12:31 AM

## 2020-10-28 DEATH — deceased

## 2021-05-15 ENCOUNTER — Other Ambulatory Visit: Payer: Self-pay | Admitting: Family Medicine

## 2021-07-14 ENCOUNTER — Other Ambulatory Visit: Payer: Self-pay | Admitting: Family Medicine

## 2021-10-12 ENCOUNTER — Other Ambulatory Visit: Payer: Self-pay | Admitting: Family Medicine

## 2022-01-10 IMAGING — DX DG HIP (WITH OR WITHOUT PELVIS) 2-3V*R*
3 series · 3 of 3 positions shown · non-contrast
Comparison: 06/22/2020

CLINICAL DATA: Right hip pain, [id number]

EXAM:
DG HIP (WITH OR WITHOUT PELVIS) 2-3V RIGHT

[pelvis ap]
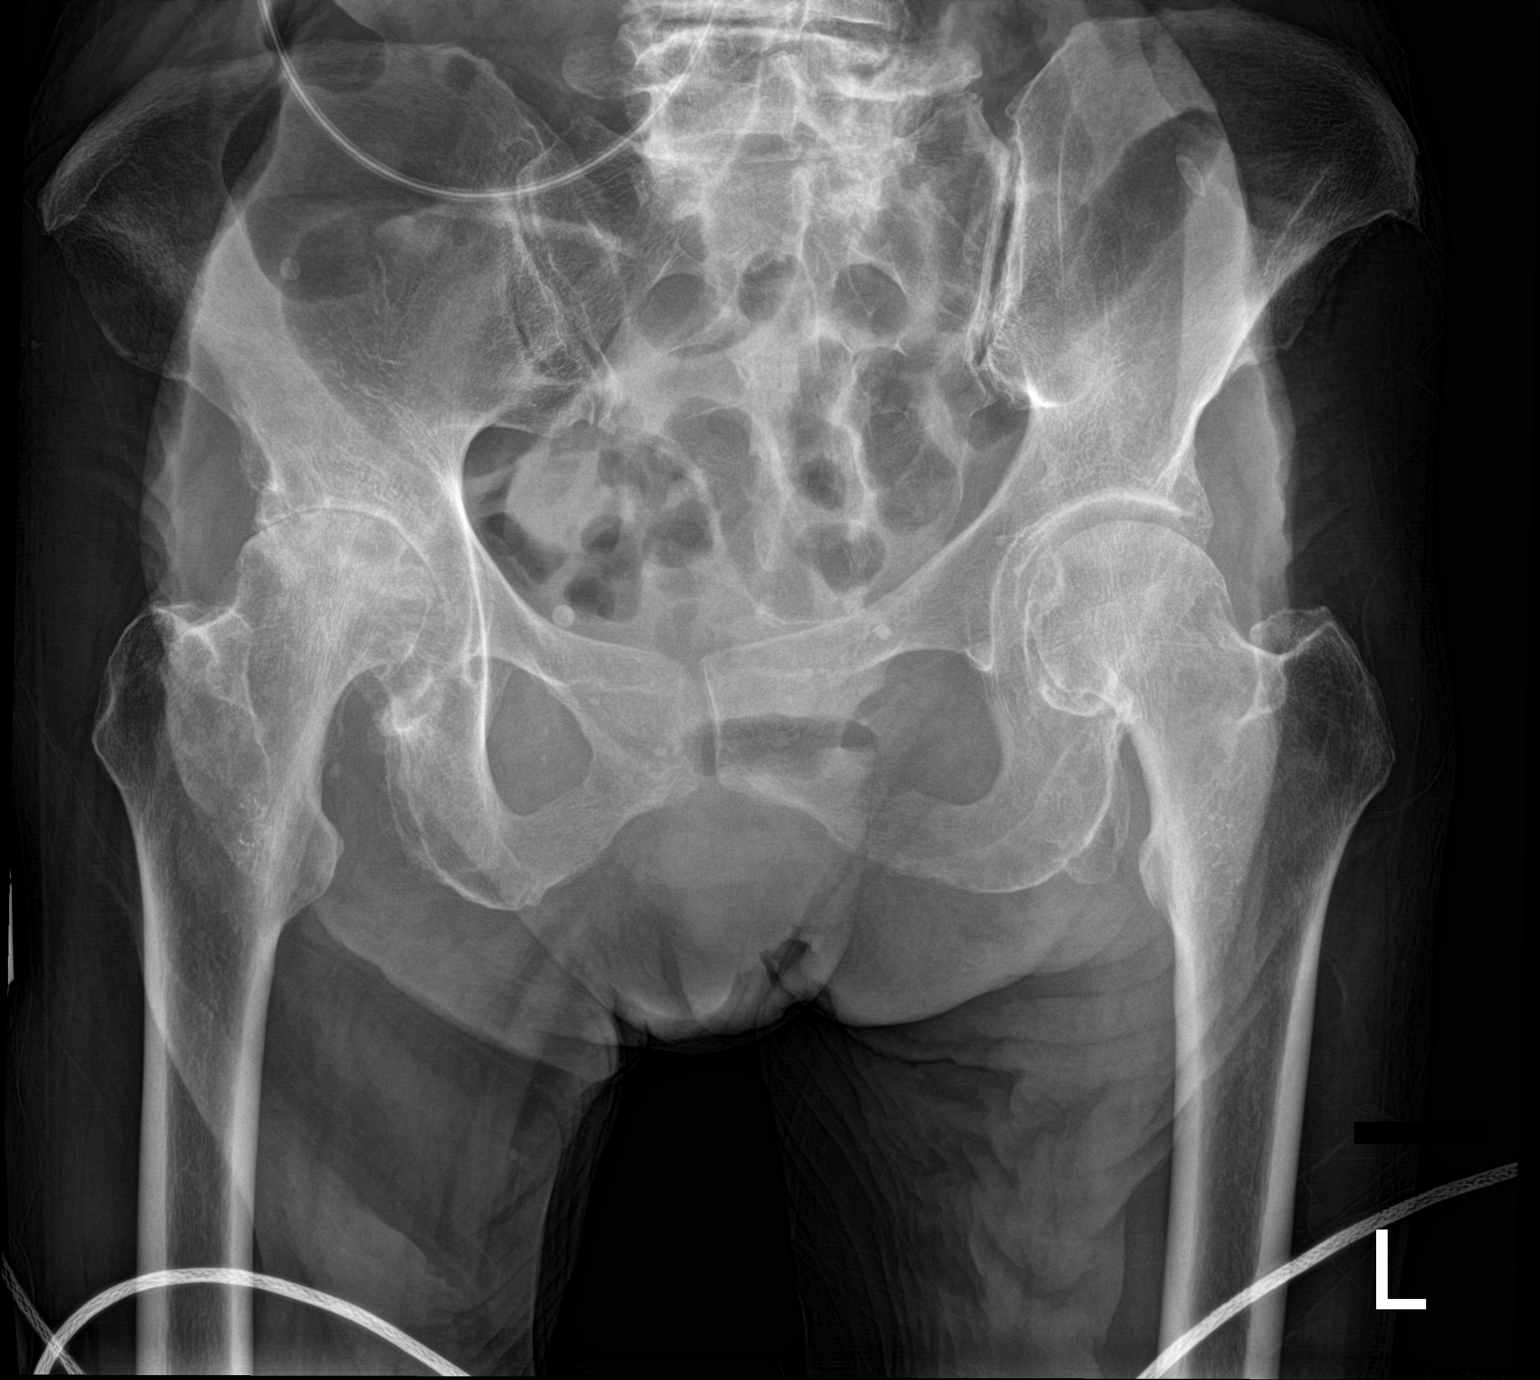

[hip ap]
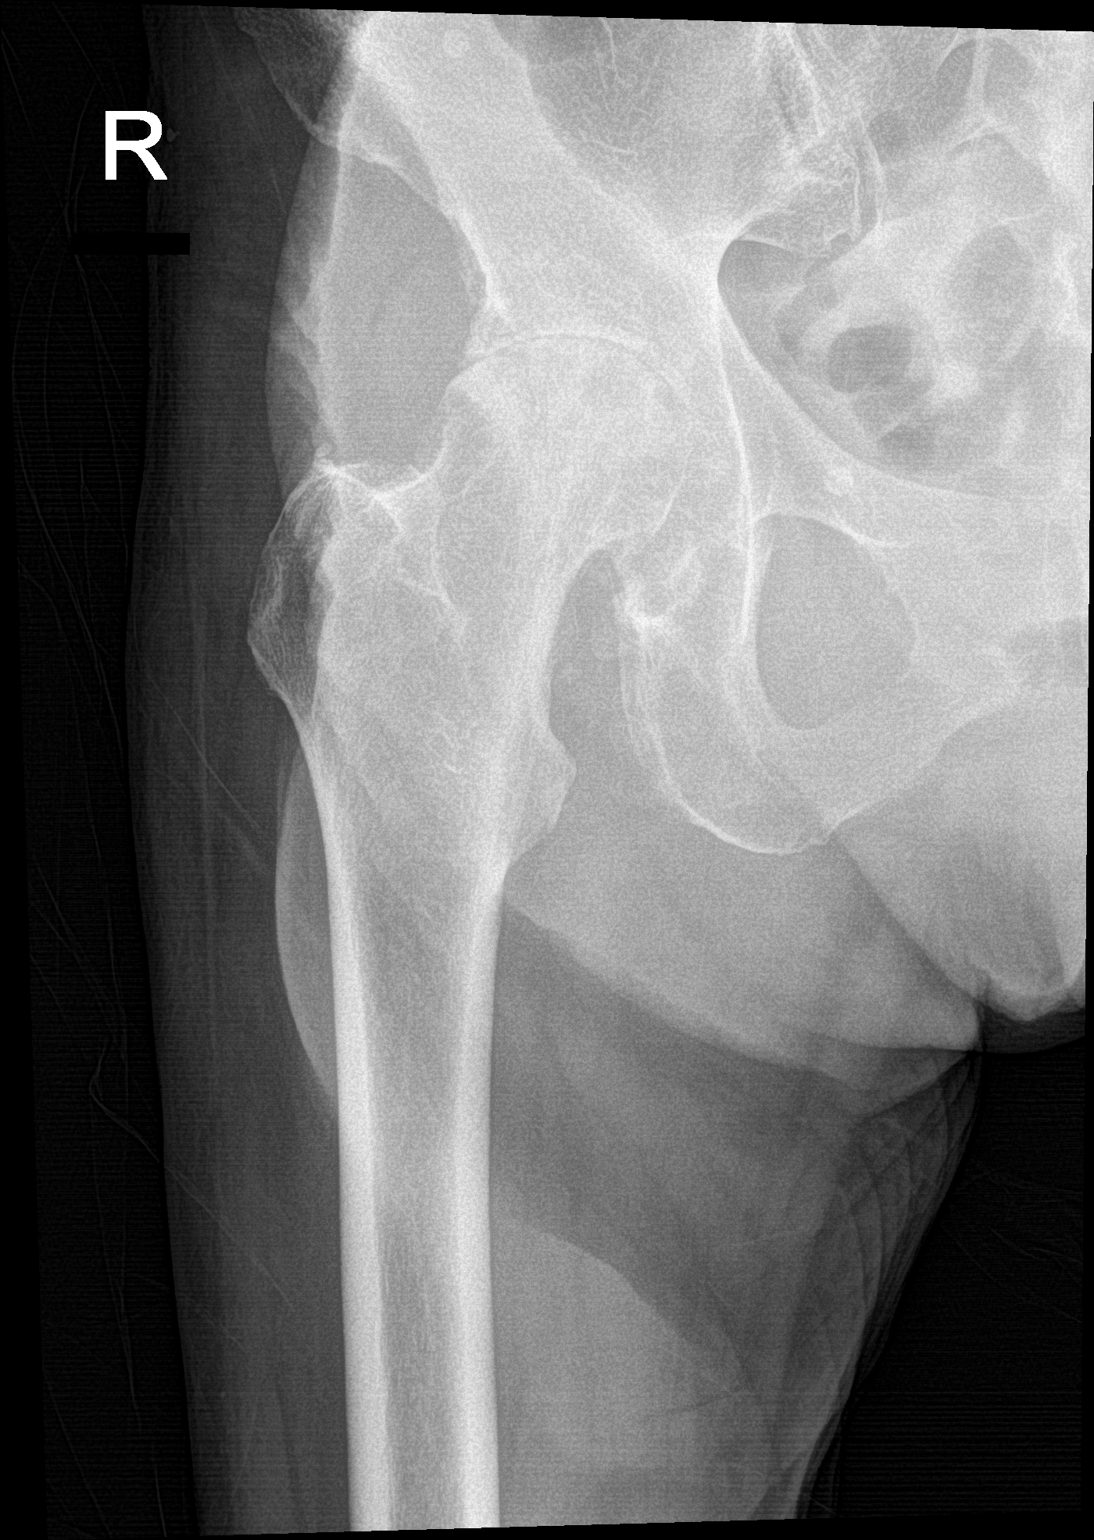

[hip lat]
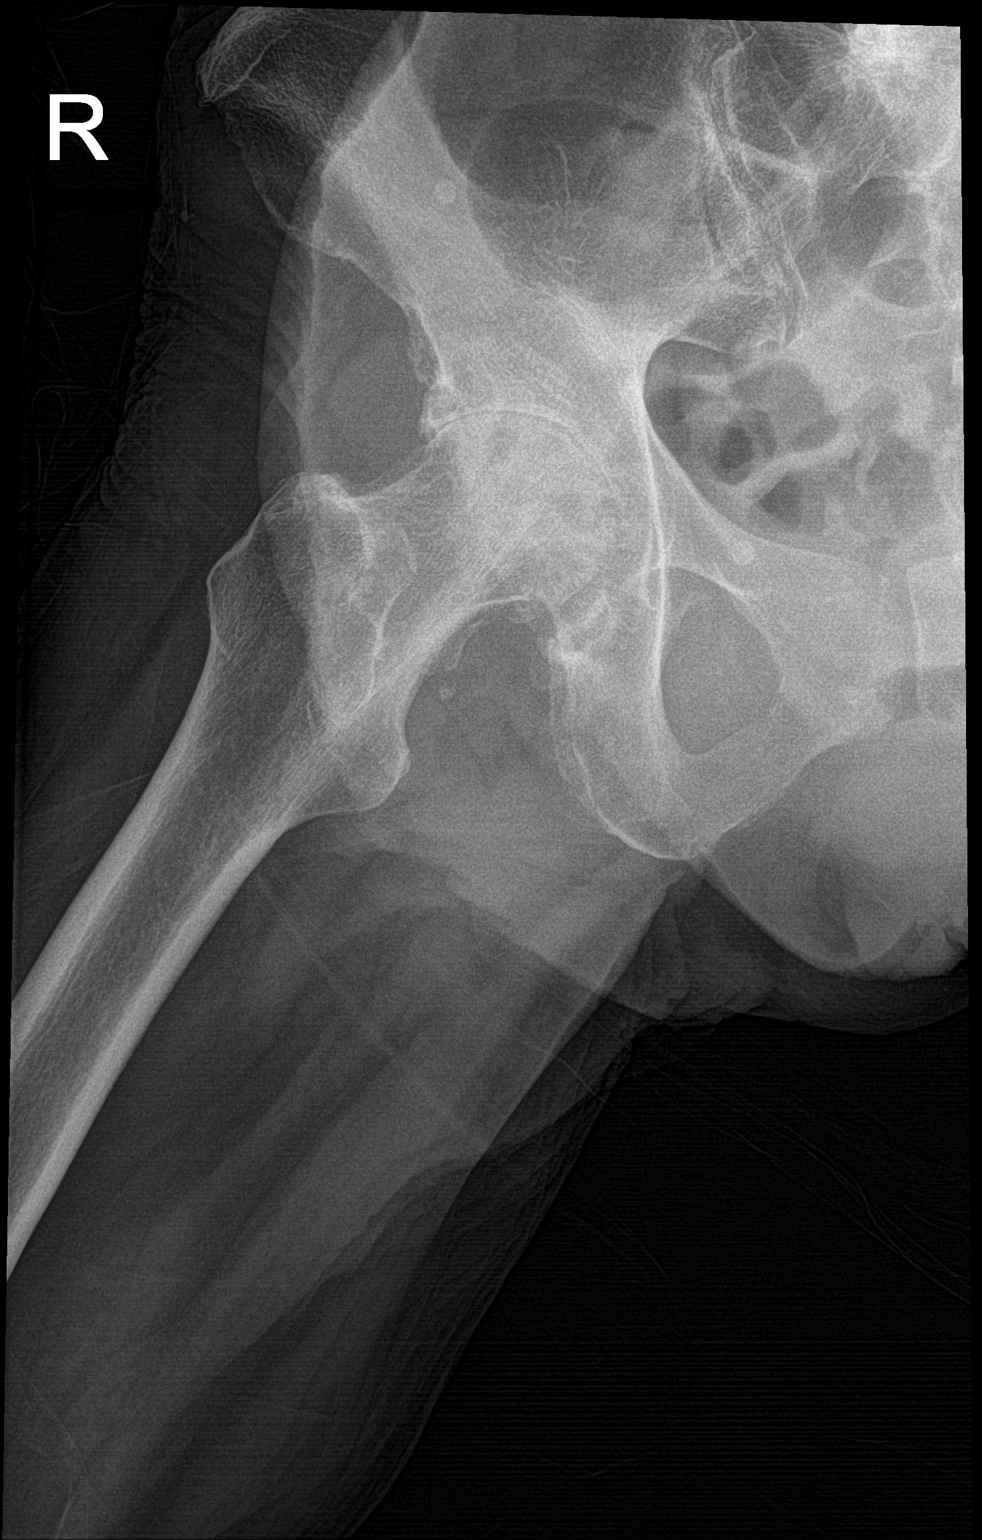

[3 of 3 positions shown; findings below may reference images not displayed]

FINDINGS: Frontal view of the pelvis as well as frontal and frogleg lateral
views of the right hip are obtained. There are no acute displaced
fractures. Severe right hip osteoarthritis is again noted, with
complete loss of joint space, subchondral cyst formation, and bony
remodeling of the femoral head and acetabulum. Left hip is
unremarkable. Prominent lower lumbar degenerative changes are
stable.
IMPRESSION: 1. Stable severe right hip osteoarthritis.  No acute fracture.
